# Patient Record
Sex: Female | Born: 1965
Health system: Southern US, Community
[De-identification: ages and names within clinical notes are randomized; demographics above are authoritative.]

## PROBLEM LIST (undated history)

## (undated) DIAGNOSIS — K219 Gastro-esophageal reflux disease without esophagitis: Secondary | ICD-10-CM

## (undated) DIAGNOSIS — Z716 Tobacco abuse counseling: Secondary | ICD-10-CM

## (undated) DIAGNOSIS — L600 Ingrowing nail: Secondary | ICD-10-CM

## (undated) DIAGNOSIS — K581 Irritable bowel syndrome with constipation: Secondary | ICD-10-CM

## (undated) DIAGNOSIS — R109 Unspecified abdominal pain: Secondary | ICD-10-CM

## (undated) DIAGNOSIS — E669 Obesity, unspecified: Secondary | ICD-10-CM

## (undated) DIAGNOSIS — E785 Hyperlipidemia, unspecified: Secondary | ICD-10-CM

## (undated) DIAGNOSIS — J40 Bronchitis, not specified as acute or chronic: Secondary | ICD-10-CM

## (undated) DIAGNOSIS — I10 Essential (primary) hypertension: Secondary | ICD-10-CM

## (undated) DIAGNOSIS — E119 Type 2 diabetes mellitus without complications: Secondary | ICD-10-CM

## (undated) DIAGNOSIS — I219 Acute myocardial infarction, unspecified: Secondary | ICD-10-CM

## (undated) DIAGNOSIS — R5383 Other fatigue: Secondary | ICD-10-CM

## (undated) DIAGNOSIS — M1711 Unilateral primary osteoarthritis, right knee: Secondary | ICD-10-CM

## (undated) DIAGNOSIS — K589 Irritable bowel syndrome without diarrhea: Secondary | ICD-10-CM

## (undated) DIAGNOSIS — E118 Type 2 diabetes mellitus with unspecified complications: Secondary | ICD-10-CM

## (undated) DIAGNOSIS — E559 Vitamin D deficiency, unspecified: Secondary | ICD-10-CM

## (undated) HISTORY — DX: Other fatigue: R53.83

## (undated) HISTORY — DX: Unilateral primary osteoarthritis, right knee: M17.11

## (undated) HISTORY — DX: Essential (primary) hypertension: I10

## (undated) HISTORY — DX: Irritable bowel syndrome with constipation: K58.1

## (undated) HISTORY — DX: Acute myocardial infarction, unspecified: I21.9

## (undated) HISTORY — DX: Unspecified abdominal pain: R10.9

## (undated) HISTORY — DX: Ingrowing nail: L60.0

## (undated) HISTORY — PX: ABDOMINAL HYSTERECTOMY: SHX81

## (undated) HISTORY — DX: Irritable bowel syndrome without diarrhea: K58.9

## (undated) HISTORY — DX: Obesity, unspecified: E66.9

## (undated) HISTORY — DX: Gastro-esophageal reflux disease without esophagitis: K21.9

## (undated) HISTORY — DX: Tobacco abuse counseling: Z71.6

## (undated) HISTORY — PX: JOINT REPLACEMENT: SHX530

## (undated) HISTORY — DX: Type 2 diabetes mellitus without complications: E11.9

## (undated) HISTORY — DX: Type 2 diabetes mellitus with unspecified complications: E11.8

## (undated) HISTORY — PX: CHOLECYSTECTOMY: SHX55

## (undated) HISTORY — PX: LUNG BIOPSY: SHX232

## (undated) HISTORY — DX: Vitamin D deficiency, unspecified: E55.9

## (undated) HISTORY — PX: APPENDECTOMY: SHX54

## (undated) HISTORY — DX: Hyperlipidemia, unspecified: E78.5

## (undated) HISTORY — DX: Bronchitis, not specified as acute or chronic: J40

---

## 2004-01-05 DIAGNOSIS — I219 Acute myocardial infarction, unspecified: Secondary | ICD-10-CM

## 2004-01-05 HISTORY — DX: Acute myocardial infarction, unspecified: I21.9

## 2017-02-18 ENCOUNTER — Encounter: Payer: Self-pay | Admitting: Family Medicine

## 2017-02-18 ENCOUNTER — Ambulatory Visit (INDEPENDENT_AMBULATORY_CARE_PROVIDER_SITE_OTHER): Payer: Medicare Other | Admitting: Family Medicine

## 2017-02-18 ENCOUNTER — Telehealth: Payer: Self-pay | Admitting: Family Medicine

## 2017-02-18 ENCOUNTER — Other Ambulatory Visit: Payer: Self-pay

## 2017-02-18 ENCOUNTER — Other Ambulatory Visit (HOSPITAL_COMMUNITY)
Admission: RE | Admit: 2017-02-18 | Discharge: 2017-02-18 | Disposition: A | Discharge status: Home / Self Care | Payer: Medicare Other | Source: Ambulatory Visit | Attending: Family Medicine | Admitting: Family Medicine

## 2017-02-18 VITALS — BP 126/72 | HR 88 | Temp 97.7°F | Resp 16 | Ht 66.0 in | Wt 186.5 lb

## 2017-02-18 DIAGNOSIS — Z1211 Encounter for screening for malignant neoplasm of colon: Secondary | ICD-10-CM | POA: Diagnosis not present

## 2017-02-18 DIAGNOSIS — E785 Hyperlipidemia, unspecified: Secondary | ICD-10-CM

## 2017-02-18 DIAGNOSIS — K581 Irritable bowel syndrome with constipation: Secondary | ICD-10-CM | POA: Diagnosis not present

## 2017-02-18 DIAGNOSIS — I10 Essential (primary) hypertension: Secondary | ICD-10-CM | POA: Diagnosis not present

## 2017-02-18 DIAGNOSIS — J40 Bronchitis, not specified as acute or chronic: Secondary | ICD-10-CM

## 2017-02-18 DIAGNOSIS — N898 Other specified noninflammatory disorders of vagina: Secondary | ICD-10-CM | POA: Insufficient documentation

## 2017-02-18 DIAGNOSIS — E1140 Type 2 diabetes mellitus with diabetic neuropathy, unspecified: Secondary | ICD-10-CM

## 2017-02-18 DIAGNOSIS — R5383 Other fatigue: Secondary | ICD-10-CM

## 2017-02-18 DIAGNOSIS — Z113 Encounter for screening for infections with a predominantly sexual mode of transmission: Secondary | ICD-10-CM | POA: Diagnosis present

## 2017-02-18 DIAGNOSIS — Z1239 Encounter for other screening for malignant neoplasm of breast: Secondary | ICD-10-CM

## 2017-02-18 DIAGNOSIS — Z794 Long term (current) use of insulin: Secondary | ICD-10-CM | POA: Diagnosis not present

## 2017-02-18 DIAGNOSIS — E559 Vitamin D deficiency, unspecified: Secondary | ICD-10-CM | POA: Diagnosis not present

## 2017-02-18 DIAGNOSIS — Z1231 Encounter for screening mammogram for malignant neoplasm of breast: Secondary | ICD-10-CM

## 2017-02-18 DIAGNOSIS — Z23 Encounter for immunization: Secondary | ICD-10-CM

## 2017-02-18 MED ORDER — AZITHROMYCIN 250 MG PO TABS: ORAL_TABLET | ORAL | 0 refills | Status: DC

## 2017-02-18 NOTE — Patient Instructions (Addendum)
Bring in BP cuff and readings Bring in all medications to visit. Follow up in 2-4 weeks.

## 2017-02-18 NOTE — Progress Notes (Signed)
Patient ID: Tasha Aguilar, female    DOB: [birth year], 52 y.o.   MRN: [id number]  Chief Complaint  Patient presents with  . Establish Care    Allergies Latex  Subjective:   Tasha Aguilar is a 52 y.o. female who presents to [facility] today.  HPI Here to establish care. Has not had a PCP in quite sometime.  Patient is fairly new to the area.  She [place] in [place].  She is originally from [place] and subsequently moved to [place], [place] then to [place], [place] and is now settled here.  She lives with her significant other whom she has been with for over 8 years.  She has 4 children who are now grow, from a previous marriage.  She does not work outside the home.  She reports that she has a history of diabetes, hypertension, and high cholesterol.  She does not remember what her last lab values were.  She reports that she has not been taking all of her medications as directed.  She reports that she does not always take the metformin or her insulin.  She reports that she believes that her sugars get low and that is why she decided not to take them.  However she does not check her blood sugars on a regular basis.  She does not have any documented hypoglycemic episodes.  She has never woken up in the middle of the night sweating or shaking.  She believes that she gets low sugars because she feels tired and weak.  She denies any lesions on her feet.  She does not know the last time that she had her eyes checked.  She reports she does take her blood pressure medicines daily.  She denies any side effects or problems with them.  However she does not like being on 3 medications to control her blood pressure.  She denies any chest pain, shortness of breath, or swelling in her extremities. She reports she is not always compliant with her cholesterol medications.  She is on a statin and TriCor.  She denies any myalgias.  She is not sure what her last cholesterol  values were or when they were checked. She reports she would like to be screened for any sexually transmitted infections today.  She reports that she has concerns that her partner could have cheated on her.  She denies any pelvic pain, abdominal pain, abnormal vaginal discharge, dysuria.  She denies any postcoital bleeding. She reports that she does have a history of bacterial vaginosis.  She reports that she takes tub baths every day.  She reports that since she quit douching and using feminine hygiene products that her bacterial vaginosis episodes have decreased.  She reports that she has had a cough for the past 2-1/2-3 weeks.  Reports her cough is productive of yellow-green sputum.  She does smoke approximately 1 pack of cigarettes per day for many years.  She is not ready to quit at this time.  She reports that she is cut down a bit since she has been sick.  She denies any chest tightness or shortness of breath.  She reports that the cough is junky and she coughs up stuff from her chest throughout the day.  Reports that at times she has some green drainage, out of her nose and not just her mouth.  She denies any fevers, vomiting, or chills.  She has not been treated or take any medication for this  infection.  She reports that nothing makes the cough better or worse.  She has never had a history of asthma as an adult.  She denies any symptoms of reflux.  She denies any swelling in her throat or airway.  She reports that she is always been followed by the gastroenterologist due to her irritable bowel syndrome.  Reports that she has been on prescription medication for this condition and would like to establish care with a gastroenterologist in this area.  She requests a referral.  She reports she is also due to have a colonoscopy performed but she would like to have the testing done that she is seen on the television.  She reports that she did have a colonoscopy greater than 10 years ago when she was  diagnosed with IBS.  She denies any family history of colon cancer.  She denies any personal history of polyps or inflammatory bowel disease.  She denies any melena, hematochezia, or bright red blood per rectum.  She reports that her energy can be low at times.  She reports that she has been taking vitamin D 50,000 units each week for over a year now.  She reports that she thought she was supposed to continue at this dose for an extended period of time.    Past Medical History:  Diagnosis Date  . AMI (acute myocardial infarction) (HCC)   . Diabetes mellitus without complication (HCC)   . Hyperlipidemia   . Hypertension     Past Surgical History:  Procedure Laterality Date  . ABDOMINAL HYSTERECTOMY     due to menorrhagia  . APPENDECTOMY    . CESAREAN SECTION    . CHOLECYSTECTOMY    . JOINT REPLACEMENT     total left knee  . LUNG BIOPSY      Family History  Problem Relation Age of Onset  . Diabetes Mother   . Glaucoma Mother   . Hypertension Mother   . Hypertension Father   . Diabetes Father   . Heart disease Father   . Glaucoma Father   . Throat cancer Father   . Kidney disease Brother   . Diabetes Brother   . Hypertension Brother   . Hypertension Brother   . Diabetes Brother      Social History   Socioeconomic History  . Marital status: Single    Spouse name: None  . Number of children: 4  . Years of education: 65  . Highest education level: 12th grade  Social Needs  . Financial resource strain: None  . Food insecurity - worry: None  . Food insecurity - inability: None  . Transportation needs - medical: None  . Transportation needs - non-medical: None  Occupational History  . None  Tobacco Use  . Smoking status: Current Every Day Smoker    Packs/day: 1.00    Years: 34.00    Pack years: 34.00  . Smokeless tobacco: Never Used  Substance and Sexual Activity  . Alcohol use: No    Frequency: Never  . Drug use: No  . Sexual activity: Yes    Birth  control/protection: Surgical  Other Topics Concern  . None  Social History Narrative   Lives in [place], single but lives with man. Has children, 4. Grew up in [place] and moved to [place] in 2017. Moved to [place] in 2018.    Enjoys movies, tv.    Eats all food groups.   Wear seat belt.   Drives.    Does not go  to church.    Current Outpatient Medications on File Prior to Visit  Medication Sig Dispense Refill  . albuterol (PROAIR HFA) 108 (90 Base) MCG/ACT inhaler Inhale into the lungs every 6 (six) hours as needed for wheezing or shortness of breath.    Marland Kitchen amLODipine (NORVASC) 10 MG tablet Take 10 mg by mouth daily.    Marland Kitchen atorvastatin (LIPITOR) 40 MG tablet Take 40 mg by mouth daily.    . diclofenac (VOLTAREN) 75 MG EC tablet Take 75 mg by mouth 2 (two) times daily.    . diclofenac sodium (VOLTAREN) 1 % GEL Apply topically 4 (four) times daily.    . fenofibrate (TRICOR) 145 MG tablet Take 145 mg by mouth daily.    Marland Kitchen gabapentin (NEURONTIN) 300 MG capsule Take 300 mg by mouth 3 (three) times daily.    Marland Kitchen glipiZIDE (GLUCOTROL) 10 MG tablet Take 10 mg by mouth daily before breakfast.    . Lancets 30G MISC by Does not apply route.    Marland Kitchen lisinopril-hydrochlorothiazide (PRINZIDE,ZESTORETIC) 20-12.5 MG tablet Take 1 tablet by mouth daily.    . metFORMIN (GLUCOPHAGE) 1000 MG tablet Take 1,000 mg by mouth daily.     . metoprolol succinate (TOPROL-XL) 100 MG 24 hr tablet Take 100 mg by mouth daily. Take with or immediately following a meal.    . montelukast (SINGULAIR) 10 MG tablet Take 10 mg by mouth at bedtime.    Marland Kitchen tiZANidine (ZANAFLEX) 4 MG capsule Take 4 mg by mouth 3 (three) times daily.    . Vitamin D, Ergocalciferol, (DRISDOL) 50000 units CAPS capsule Take 50,000 Units by mouth every 7 (seven) days.    . Eluxadoline (VIBERZI) 75 MG TABS Take by mouth.    . Insulin Detemir (LEVEMIR FLEXTOUCH) 100 UNIT/ML Pen Inject into the skin daily at 10 pm.     No current facility-administered medications on file  prior to visit.     Review of Systems  Constitutional: Negative for activity change, appetite change and fever.  HENT: Positive for congestion, postnasal drip, rhinorrhea and sinus pressure. Negative for dental problem, mouth sores, nosebleeds, sneezing, sore throat, tinnitus, trouble swallowing and voice change.   Eyes: Negative for visual disturbance.  Respiratory: Positive for cough. Negative for chest tightness and shortness of breath.   Cardiovascular: Negative for chest pain, palpitations and leg swelling.  Gastrointestinal: Positive for constipation. Negative for abdominal pain, nausea and vomiting.  Genitourinary: Negative for dysuria, frequency, urgency, vaginal discharge and vaginal pain.  Musculoskeletal: Negative for arthralgias, gait problem and neck pain.  Skin: Negative for rash.  Neurological: Negative for dizziness, tremors, syncope, weakness and light-headedness.  Hematological: Negative for adenopathy.  Psychiatric/Behavioral: Negative for behavioral problems, dysphoric mood and sleep disturbance. The patient is not nervous/anxious.      Objective:   BP 126/72 (BP Location: Left Arm, Patient Position: Sitting, Cuff Size: Normal)   Pulse 88   Temp 97.7 F (36.5 C) (Temporal)   Resp 16   Ht 5\' 6"  (1.676 m)   Wt 186 lb 8 oz (84.6 kg)   SpO2 98%   BMI 30.10 kg/m   Physical Exam  Constitutional: She is oriented to person, place, and time. She appears well-developed and well-nourished. No distress.  HENT:  Head: Normocephalic and atraumatic.  Right Ear: External ear normal.  Left Ear: External ear normal.  Nose: Nose normal.  Mouth/Throat: Oropharynx is clear and moist. No oropharyngeal exudate.  Eyes: Conjunctivae and EOM are normal. Pupils are equal, round, and reactive  to light. No scleral icterus.  Neck: Normal range of motion. Neck supple. No JVD present. No tracheal deviation present. No thyromegaly present.  Cardiovascular: Normal rate, regular rhythm and  normal heart sounds.  Pulmonary/Chest: Effort normal and breath sounds normal. No respiratory distress. She has no wheezes. She exhibits no tenderness.  Abdominal: Soft. Bowel sounds are normal. She exhibits no distension. There is no tenderness.  Musculoskeletal: Normal range of motion.  Lymphadenopathy:    She has no cervical adenopathy.  Neurological: She is alert and oriented to person, place, and time. No cranial nerve deficit or sensory deficit. Coordination normal.  Skin: Skin is warm and dry.  Psychiatric: She has a normal mood and affect. Her behavior is normal. Judgment and thought content normal.  Nursing note and vitals reviewed.    Assessment and Plan  1. Essential hypertension Discussed with patient today that her blood pressure is under good control.  We discussed her goal blood pressure is less than 130/80.  She will continue all of her medications at this time.  She is not happy about being on 3 medications for blood pressure, however her blood pressure  is at goal.  I did ask her to please bring in all her medication bottles to her next visit.  I told her there is a possibility of stopping one medication and increasing the lisinopril/HCTZ.  Lifestyle modifications discussed with patient including a diet emphasizing vegetables, fruits, and whole grains. Limiting intake of sodium to less than 2,400 mg per day.  Recommendations discussed include consuming low-fat dairy products, poultry, fish, legumes, non-tropical vegetable oils, and nuts; and limiting intake of sweets, sugar-sweetened beverages, and red meat. Discussed following a plan such as the Dietary Approaches to Stop Hypertension (DASH) diet. Patient to read up on this diet.    2. Hyperlipidemia LDL goal <70 Patient is currently on atorvastatin and TriCor.  We discussed her LDL goal.  Will check FLP today and determine if she is at goal.  She would like to stop 1 of these medications.  I am in agreement as long as her  triglycerides are not vastly elevated.  She will continue both medications at this time and we will discuss at her follow-up.  Hyperlipidemia and the associated risk of ASCVD were discussed today. Primary vs. Secondary prevention of ASCVD were discussed and how it relates to patient morbidity, mortality, and quality of life. Shared decision making with patient including the risks of statins vs.benefits of ASCVD risk reduction discussed.  Risks of stains discussed including myopathy, rhabdomyoloysis, liver problems, increased risk of diabetes discussed. We discussed heart healthy diet, lifestyle modifications, risk factor modifications, and adherence to the recommended treatment plan. We discussed the need to periodically monitor lipid panel and liver function tests while on statin therapy.   - Lipid panel  3. Type 2 diabetes mellitus with diabetic neuropathy, with long-term current use of insulin (HCC) Patient has no idea where her current A1c is.  She is currently on metformin, Glucotrol, and reports that she rarely uses her basal insulin.  We will check hemoglobin A1c and adjust as needed. - Hemoglobin A1c - COMPLETE METABOLIC PANEL WITH GFR - Microalbumin / creatinine urine ratio  4. Bronchitis Patient counseled in detail regarding the risks of medication. Told to call or return to clinic if develop any worrisome signs or symptoms. Patient voiced understanding.   for bronchitis at this time.rhmed - azithromycin (ZITHROMAX) 250 MG tablet; Take 2 pills for the first day and one pill  each day for the next four days.  Dispense: 6 tablet; Refill: 0  5. Screen for STD (sexually transmitted disease) Patient request screening today. - HIV antibody - RPR - Hepatitis panel, acute - Urine cytology ancillary only  6. Screening for breast cancer Mammogram referral placed.  We will plan to do clinical breast exam at subsequent visit. - MM Digital Screening; Future  7. Immunization due Vaccinations  performed today.  Patient defers influenza vaccine.  She was administered Tdap and pneumonia shot today. Will request immunization record today from previous PCP.  - Tdap vaccine greater than or equal to 7yo IM - Flu Vaccine QUAD 6+ mos PF IM (Fluarix Quad PF) - Pneumococcal conjugate vaccine 13-valent  8. Screen for colon cancer Referral placed today.  - Cologuard  9. Vitamin D deficiency Has been on long-term 50,000 units each week. Check labs today.  - VITAMIN D 25 Hydroxy (Vit-D Deficiency, Fractures)  10. Fatigue, unspecified type Check labs today.  - TSH - CBC with Differential 11.  IBS Referred to gastroenterology per patient request. Return in about 4 weeks (around 03/18/2017). Aliene Beams, MD 02/18/2017

## 2017-02-18 NOTE — Telephone Encounter (Signed)
Patient had asked at the end of her appointment if Dr. Tracie HarrierHagler would write a prescription for an antibiotic prior to a dental appointment on Wednesday due to her knee replacement. Per Dr. Tracie HarrierHagler, it is no longer recommended to prescribe antibiotics prior to dental work like that. She states if the dentist wants her to have one, he will need to prescribe. Called patient to inform, phone just rings with no voicemail.

## 2017-02-19 ENCOUNTER — Encounter: Payer: Self-pay | Admitting: Family Medicine

## 2017-02-19 DIAGNOSIS — R5383 Other fatigue: Secondary | ICD-10-CM | POA: Insufficient documentation

## 2017-02-19 DIAGNOSIS — E559 Vitamin D deficiency, unspecified: Secondary | ICD-10-CM

## 2017-02-19 DIAGNOSIS — I10 Essential (primary) hypertension: Secondary | ICD-10-CM | POA: Insufficient documentation

## 2017-02-19 DIAGNOSIS — J40 Bronchitis, not specified as acute or chronic: Secondary | ICD-10-CM | POA: Insufficient documentation

## 2017-02-19 DIAGNOSIS — E785 Hyperlipidemia, unspecified: Secondary | ICD-10-CM | POA: Insufficient documentation

## 2017-02-19 DIAGNOSIS — Z23 Encounter for immunization: Secondary | ICD-10-CM | POA: Insufficient documentation

## 2017-02-19 DIAGNOSIS — E118 Type 2 diabetes mellitus with unspecified complications: Secondary | ICD-10-CM

## 2017-02-19 DIAGNOSIS — K581 Irritable bowel syndrome with constipation: Secondary | ICD-10-CM | POA: Insufficient documentation

## 2017-02-19 DIAGNOSIS — Z1211 Encounter for screening for malignant neoplasm of colon: Secondary | ICD-10-CM | POA: Insufficient documentation

## 2017-02-19 DIAGNOSIS — Z1239 Encounter for other screening for malignant neoplasm of breast: Secondary | ICD-10-CM | POA: Insufficient documentation

## 2017-02-19 HISTORY — DX: Irritable bowel syndrome with constipation: K58.1

## 2017-02-19 HISTORY — DX: Essential (primary) hypertension: I10

## 2017-02-19 HISTORY — DX: Vitamin D deficiency, unspecified: E55.9

## 2017-02-19 HISTORY — DX: Bronchitis, not specified as acute or chronic: J40

## 2017-02-19 HISTORY — DX: Hyperlipidemia, unspecified: E78.5

## 2017-02-19 HISTORY — DX: Type 2 diabetes mellitus with unspecified complications: E11.8

## 2017-02-19 HISTORY — DX: Other fatigue: R53.83

## 2017-02-21 ENCOUNTER — Telehealth: Payer: Self-pay | Admitting: Family Medicine

## 2017-02-21 LAB — CBC WITH DIFFERENTIAL/PLATELET
Basophils Absolute: 11 cells/uL (ref 0–200)
Basophils Relative: 0.2 %
Eosinophils Absolute: 59 cells/uL (ref 15–500)
Eosinophils Relative: 1.1 %
HCT: 39.1 % (ref 35.0–45.0)
Hemoglobin: 12.9 g/dL (ref 11.7–15.5)
Lymphs Abs: 2252 cells/uL (ref 850–3900)
MCH: 26.2 pg — ABNORMAL LOW (ref 27.0–33.0)
MCHC: 33 g/dL (ref 32.0–36.0)
MCV: 79.5 fL — ABNORMAL LOW (ref 80.0–100.0)
MPV: 10.4 fL (ref 7.5–12.5)
Monocytes Relative: 6.5 %
Neutro Abs: 2727 cells/uL (ref 1500–7800)
Neutrophils Relative %: 50.5 %
Platelets: 340 10*3/uL (ref 140–400)
RBC: 4.92 10*6/uL (ref 3.80–5.10)
RDW: 13.4 % (ref 11.0–15.0)
Total Lymphocyte: 41.7 %
WBC mixed population: 351 cells/uL (ref 200–950)
WBC: 5.4 10*3/uL (ref 3.8–10.8)

## 2017-02-21 LAB — RPR: RPR Ser Ql: NONREACTIVE

## 2017-02-21 LAB — URINE CYTOLOGY ANCILLARY ONLY
Chlamydia: NEGATIVE
Neisseria Gonorrhea: NEGATIVE
Trichomonas: NEGATIVE

## 2017-02-21 LAB — COMPLETE METABOLIC PANEL WITH GFR
AG Ratio: 1.5 (calc) (ref 1.0–2.5)
ALT: 17 U/L (ref 6–29)
AST: 18 U/L (ref 10–35)
Albumin: 4.6 g/dL (ref 3.6–5.1)
Alkaline phosphatase (APISO): 109 U/L (ref 33–130)
BUN/Creatinine Ratio: 14 (calc) (ref 6–22)
BUN: 15 mg/dL (ref 7–25)
CO2: 27 mmol/L (ref 20–32)
Calcium: 10.7 mg/dL — ABNORMAL HIGH (ref 8.6–10.4)
Chloride: 109 mmol/L (ref 98–110)
Creat: 1.08 mg/dL — ABNORMAL HIGH (ref 0.50–1.05)
GFR, Est African American: 68 mL/min/{1.73_m2} (ref >=60)
GFR, Est Non African American: 59 mL/min/{1.73_m2} — ABNORMAL LOW (ref >=60)
Globulin: 3.1 g/dL (calc) (ref 1.9–3.7)
Glucose, Bld: 66 mg/dL (ref 65–99)
Potassium: 4.3 mmol/L (ref 3.5–5.3)
Sodium: 143 mmol/L (ref 135–146)
Total Bilirubin: 0.4 mg/dL (ref 0.2–1.2)
Total Protein: 7.7 g/dL (ref 6.1–8.1)

## 2017-02-21 LAB — MICROALBUMIN / CREATININE URINE RATIO
Creatinine, Urine: 145 mg/dL (ref 20–275)
Microalb Creat Ratio: 19 mcg/mg creat (ref <30)
Microalb, Ur: 2.8 mg/dL

## 2017-02-21 LAB — HEPATITIS PANEL, ACUTE
Hep A IgM: NONREACTIVE
Hep B C IgM: NONREACTIVE
Hepatitis B Surface Ag: NONREACTIVE
Hepatitis C Ab: NONREACTIVE
SIGNAL TO CUT-OFF: 0.01 (ref <1.00)

## 2017-02-21 LAB — LIPID PANEL
Cholesterol: 191 mg/dL (ref <200)
HDL: 38 mg/dL — ABNORMAL LOW (ref >50)
LDL Cholesterol (Calc): 127 mg/dL (calc) — ABNORMAL HIGH
Non-HDL Cholesterol (Calc): 153 mg/dL (calc) — ABNORMAL HIGH (ref <130)
Total CHOL/HDL Ratio: 5 (calc) — ABNORMAL HIGH (ref <5.0)
Triglycerides: 147 mg/dL (ref <150)

## 2017-02-21 LAB — VITAMIN D 25 HYDROXY (VIT D DEFICIENCY, FRACTURES): Vit D, 25-Hydroxy: 39 ng/mL (ref 30–100)

## 2017-02-21 LAB — HEMOGLOBIN A1C
Hgb A1c MFr Bld: 10 % of total Hgb — ABNORMAL HIGH (ref <5.7)
Mean Plasma Glucose: 240 (calc)
eAG (mmol/L): 13.3 (calc)

## 2017-02-21 LAB — TSH: TSH: 1.26 mIU/L

## 2017-02-21 LAB — HIV ANTIBODY (ROUTINE TESTING W REFLEX): HIV 1&2 Ab, 4th Generation: NONREACTIVE

## 2017-02-21 NOTE — Telephone Encounter (Signed)
I have tried to call patient back about this before. Per Dr. Tracie HarrierHagler, this is no longer recommended practice. If her dentist wants her to have the antibiotic, he should prescribed. Tried to return call, no answer.

## 2017-02-21 NOTE — Telephone Encounter (Signed)
Patient left a voicemail requesting information about a previous request for antibiotics for a dental appt, due to a total knee replacement she had..  [phone]

## 2017-02-22 ENCOUNTER — Encounter: Payer: Self-pay | Admitting: Internal Medicine

## 2017-02-22 ENCOUNTER — Other Ambulatory Visit: Payer: Self-pay | Admitting: Family Medicine

## 2017-02-23 ENCOUNTER — Telehealth: Payer: Self-pay | Admitting: Family Medicine

## 2017-02-23 LAB — URINE CYTOLOGY ANCILLARY ONLY: Candida vaginitis: NEGATIVE

## 2017-02-23 MED ORDER — METRONIDAZOLE 500 MG PO TABS: 500.0000 mg | ORAL_TABLET | Freq: Two times a day (BID) | ORAL | 0 refills | Status: DC

## 2017-02-23 NOTE — Telephone Encounter (Signed)
Please call and advised that her HIV, syphilis, and hepatitis testing are negative.  Advised her that her labs do show that she has bacterial vaginosis infection and we should treat with antibiotics.  She should start Flagyl 500 mg 1 pill twice a day for 7 days.  She cannot drink alcohol while taking this medicine for 3 days after stopping this medication.  If she does it can make her very sick.  Please advise her that her hemoglobin A1c is elevated at 10%.  Has she been taking the metformin twice a day, the Glucotrol 10 mg a day, and using the Levemir at night.  It is very important for me to know what medicine she has actually been using consistently over the past 3 months.  It is important for me to know because it affects what medicines I put her on now and what dose of insulin I would put her on to help get her sugars under control.

## 2017-02-24 NOTE — Telephone Encounter (Signed)
Called patient regarding message below. No answer, unable to leave message.  

## 2017-02-25 ENCOUNTER — Other Ambulatory Visit: Payer: Self-pay | Admitting: Family Medicine

## 2017-02-25 NOTE — Telephone Encounter (Signed)
Called patient regarding message below. No answer, unable to leave message.  

## 2017-03-02 NOTE — Telephone Encounter (Signed)
I have been unable to reach this patient by phone.  A letter is being sent.

## 2017-03-10 DIAGNOSIS — H35032 Hypertensive retinopathy, left eye: Secondary | ICD-10-CM | POA: Diagnosis not present

## 2017-03-10 DIAGNOSIS — H35031 Hypertensive retinopathy, right eye: Secondary | ICD-10-CM | POA: Diagnosis not present

## 2017-03-10 LAB — HM DIABETES EYE EXAM

## 2017-03-14 DIAGNOSIS — Z1212 Encounter for screening for malignant neoplasm of rectum: Secondary | ICD-10-CM | POA: Diagnosis not present

## 2017-03-14 DIAGNOSIS — Z1211 Encounter for screening for malignant neoplasm of colon: Secondary | ICD-10-CM | POA: Diagnosis not present

## 2017-03-14 LAB — COLOGUARD: Cologuard: NEGATIVE

## 2017-03-23 ENCOUNTER — Encounter: Payer: Self-pay | Admitting: Family Medicine

## 2017-03-23 ENCOUNTER — Other Ambulatory Visit: Payer: Self-pay

## 2017-03-23 ENCOUNTER — Other Ambulatory Visit: Payer: Self-pay | Admitting: Family Medicine

## 2017-03-23 ENCOUNTER — Ambulatory Visit (INDEPENDENT_AMBULATORY_CARE_PROVIDER_SITE_OTHER): Payer: Medicare Other | Admitting: Family Medicine

## 2017-03-23 VITALS — BP 162/98 | HR 68 | Temp 98.0°F | Resp 16 | Ht 66.0 in | Wt 185.8 lb

## 2017-03-23 DIAGNOSIS — E119 Type 2 diabetes mellitus without complications: Secondary | ICD-10-CM | POA: Diagnosis not present

## 2017-03-23 DIAGNOSIS — I10 Essential (primary) hypertension: Secondary | ICD-10-CM

## 2017-03-23 DIAGNOSIS — E559 Vitamin D deficiency, unspecified: Secondary | ICD-10-CM | POA: Diagnosis not present

## 2017-03-23 DIAGNOSIS — E785 Hyperlipidemia, unspecified: Secondary | ICD-10-CM | POA: Diagnosis not present

## 2017-03-23 DIAGNOSIS — Z1231 Encounter for screening mammogram for malignant neoplasm of breast: Secondary | ICD-10-CM

## 2017-03-23 DIAGNOSIS — E1169 Type 2 diabetes mellitus with other specified complication: Secondary | ICD-10-CM | POA: Diagnosis not present

## 2017-03-23 DIAGNOSIS — Z720 Tobacco use: Secondary | ICD-10-CM | POA: Diagnosis not present

## 2017-03-23 MED ORDER — HYDROCHLOROTHIAZIDE 25 MG PO TABS: 25.0000 mg | ORAL_TABLET | Freq: Every day | ORAL | 3 refills | Status: DC

## 2017-03-23 MED ORDER — LISINOPRIL 40 MG PO TABS: 40.0000 mg | ORAL_TABLET | Freq: Every day | ORAL | 3 refills | Status: AC

## 2017-03-23 MED ORDER — AMLODIPINE BESYLATE 10 MG PO TABS: 10.0000 mg | ORAL_TABLET | Freq: Every day | ORAL | 1 refills | Status: DC

## 2017-03-23 MED ORDER — VITAMIN D (ERGOCALCIFEROL) 1.25 MG (50000 UNIT) PO CAPS: 50000.0000 [IU] | ORAL_CAPSULE | ORAL | 1 refills | Status: DC

## 2017-03-23 MED ORDER — VARENICLINE TARTRATE 0.5 MG X 11 & 1 MG X 42 PO MISC: ORAL | 0 refills | Status: DC

## 2017-03-23 MED ORDER — ATORVASTATIN CALCIUM 40 MG PO TABS: 40.0000 mg | ORAL_TABLET | Freq: Every day | ORAL | 1 refills | Status: DC

## 2017-03-23 MED ORDER — FENOFIBRATE 145 MG PO TABS: 145.0000 mg | ORAL_TABLET | Freq: Every day | ORAL | 0 refills | Status: DC

## 2017-03-23 MED ORDER — GLIPIZIDE ER 5 MG PO TB24: 5.0000 mg | ORAL_TABLET | Freq: Every day | ORAL | 1 refills | Status: DC

## 2017-03-23 NOTE — Progress Notes (Signed)
Patient ID: Tasha Aguilar, female    DOB: 07/27/1965, 52 y.o.   MRN: [id number]  Chief Complaint  Patient presents with  . Follow-up    Allergies Latex  Subjective:   Tasha Aguilar is a 52 y.o. female who presents to [facility] today.  HPI [name] presents today for follow-up visit.  She reports that she has been out of her blood pressure medication and has not taken them in several weeks.  She denies any headaches, chest pain, shortness of breath, or swelling in her extremities.  She has previously been on lisinopril/HCTZ 20/12.5 mg a day, amlodipine 10 mg a day, and Toprol-XL 100 mg a day.   Has been taking the lipitor and tricor each day.   She reports that for her blood sugar she has been doing the metformin 1000 mg twice a day since she was last seen here.  She has not been taking the glipizide at all.  She has not been using the insulin.  She reports that if she takes the glipizide 10 mg that she feels like her sugars drop and she gets a hypoglycemic episode.  She would like to review her labs today.  She did not have her cholesterol done at the last visit.  In addition, she had a slightly elevated calcium on her blood work.  She is taking vitamin D 50,000 units a week.  Her vitamin D levels were within the normal range but not exactly at target.  She denies any chest pain, shortness of breath, or swelling in her extremities.  She reports her mood is good.  Energy level has been better.  Denies any side effects with her current medications.  She does bring in reading of her blood pressure which is been in the 140s-180s over 90s-100s.  Most of her readings have been in the 160s-170 systolic.  She denies any headache, nausea, vomiting, vision changes.  Denies any numbness or tingling in her extremities.  She is not currently on an aspirin.  She brings in readings of her blood sugars which have improved over the past couple weeks and her morning sugars have been in  the 120s-140s.  Denies any hypoglycemic episodes.  She reports that she would like to quit smoking.  She used Chantix in the past and it really helped her.  She does report she quit smoking but then restarted again.  She has smoked for over 30 years, 1 pack/day.  She has her first cigarette in the morning.  She has had over 4 quit attempts.  Her brother recently passed away of cancer and she reports she is motivated to quit smoking at this time.   Past Medical History:  Diagnosis Date  . AMI (acute myocardial infarction) (HCC)   . Diabetes mellitus without complication (HCC)   . Hyperlipidemia   . Hypertension     Past Surgical History:  Procedure Laterality Date  . ABDOMINAL HYSTERECTOMY     due to menorrhagia  . APPENDECTOMY    . CESAREAN SECTION    . CHOLECYSTECTOMY    . JOINT REPLACEMENT     total left knee  . LUNG BIOPSY      Family History  Problem Relation Age of Onset  . Diabetes Mother   . Glaucoma Mother   . Hypertension Mother   . Hypertension Father   . Diabetes Father   . Heart disease Father   . Glaucoma Father   . Throat cancer Father   .  Kidney disease Brother   . Diabetes Brother   . Hypertension Brother   . Hypertension Brother   . Diabetes Brother      Social History   Socioeconomic History  . Marital status: Single    Spouse name: Not on file  . Number of children: 4  . Years of education: 41  . Highest education level: 12th grade  Occupational History  . Not on file  Social Needs  . Financial resource strain: Not on file  . Food insecurity:    Worry: Not on file    Inability: Not on file  . Transportation needs:    Medical: Not on file    Non-medical: Not on file  Tobacco Use  . Smoking status: Current Every Day Smoker    Packs/day: 1.00    Years: 34.00    Pack years: 34.00  . Smokeless tobacco: Never Used  Substance and Sexual Activity  . Alcohol use: No    Frequency: Never  . Drug use: No  . Sexual activity: Yes    Birth  control/protection: Surgical  Lifestyle  . Physical activity:    Days per week: Not on file    Minutes per session: Not on file  . Stress: Not on file  Relationships  . Social connections:    Talks on phone: Not on file    Gets together: Not on file    Attends religious service: Not on file    Active member of club or organization: Not on file    Attends meetings of clubs or organizations: Not on file    Relationship status: Not on file  Other Topics Concern  . Not on file  Social History Narrative   Lives in [place], single but lives with man. Has children, 4. Grew up in [place] and moved to [place] in 2017. Moved to [place] in 2018.    Enjoys movies, tv.    Eats all food groups.   Wear seat belt.   Drives.    Does not go to church.    Current Outpatient Medications on File Prior to Visit  Medication Sig Dispense Refill  . albuterol (PROAIR HFA) 108 (90 Base) MCG/ACT inhaler Inhale into the lungs every 6 (six) hours as needed for wheezing or shortness of breath.    . diclofenac sodium (VOLTAREN) 1 % GEL Apply topically 4 (four) times daily.    Marland Kitchen gabapentin (NEURONTIN) 300 MG capsule Take 300 mg by mouth 3 (three) times daily.    . metFORMIN (GLUCOPHAGE) 1000 MG tablet Take 1,000 mg by mouth daily.     . montelukast (SINGULAIR) 10 MG tablet Take 10 mg by mouth at bedtime.    Marland Kitchen tiZANidine (ZANAFLEX) 4 MG capsule Take 4 mg by mouth 3 (three) times daily.     No current facility-administered medications on file prior to visit.     Review of Systems  Constitutional: Negative for activity change, appetite change and fever.  Eyes: Negative for visual disturbance.  Respiratory: Negative for cough, chest tightness and shortness of breath.   Cardiovascular: Negative for chest pain, palpitations and leg swelling.  Gastrointestinal: Negative for abdominal pain, nausea and vomiting.  Genitourinary: Negative for dysuria, frequency and urgency.  Neurological: Negative for dizziness, syncope and  light-headedness.  Hematological: Negative for adenopathy.     Objective:   BP (!) 162/98 (BP Location: Left Arm, Patient Position: Sitting, Cuff Size: Normal)   Pulse 68   Temp 98 F (36.7 C) (Temporal)   Resp  16   Ht 5\' 6"  (1.676 m)   Wt 185 lb 12 oz (84.3 kg)   SpO2 99%   BMI 29.98 kg/m   Physical Exam  Constitutional: She is oriented to person, place, and time. She appears well-developed and well-nourished. No distress.  HENT:  Head: Normocephalic and atraumatic.  Eyes: Pupils are equal, round, and reactive to light.  Neck: Normal range of motion. Neck supple. No thyromegaly present.  Cardiovascular: Normal rate, regular rhythm and normal heart sounds.  Pulmonary/Chest: Effort normal and breath sounds normal. No respiratory distress.  Neurological: She is alert and oriented to person, place, and time. No cranial nerve deficit.  Skin: Skin is warm and dry.  Nursing note and vitals reviewed.    Assessment and Plan  1. Vitamin D deficiency Vitamin D levels are improving.  Continue for target goal of vitamin D levels of 50.  Continue medications for the next 3 months and then repeat. - Vitamin D, Ergocalciferol, (DRISDOL) 50000 units CAPS capsule; Take 1 capsule (50,000 Units total) by mouth every 7 (seven) days.  Dispense: 12 capsule; Refill: 1  2. Serum calcium elevated Serum calcium possibly elevated secondary to hyperglycemia.  Check PTH and calcium today. - PTH, Intact and Calcium - Calcium, ionized  3. Hyperlipidemia associated with type 2 diabetes mellitus (HCC) Check FLP.  Recommend continue atorvastatin and TriCor at this time due to her reported history of triglycerides in the 600s. - Lipid Profile - atorvastatin (LIPITOR) 40 MG tablet; Take 1 tablet (40 mg total) by mouth daily.  Dispense: 90 tablet; Refill: 1 - fenofibrate (TRICOR) 145 MG tablet; Take 1 tablet (145 mg total) by mouth daily.  Dispense: 90 tablet; Refill: 0  4. HTN, goal below 130/80 She is  currently on no blood pressure medication for the past several weeks.  Will restart Norvasc.  We will discontinue the Toprol-XL.  Will increase the dose of lisinopril to 40 mg a day.  And will continue HCTZ but at 25 mg a day. - amLODipine (NORVASC) 10 MG tablet; Take 1 tablet (10 mg total) by mouth daily.  Dispense: 90 tablet; Refill: 1 - lisinopril (PRINIVIL,ZESTRIL) 40 MG tablet; Take 1 tablet (40 mg total) by mouth daily.  Dispense: 90 tablet; Refill: 3 - hydrochlorothiazide (HYDRODIURIL) 25 MG tablet; Take 1 tablet (25 mg total) by mouth daily.  Dispense: 90 tablet; Refill: 3  5. Type 2 diabetes mellitus without complication, without long-term current use of insulin (HCC) She is not willing to be on any insulin at this time.  Her blood sugar readings do look improved.  We will add Glucotrol XL 5 mg 1 p.o. daily.  Patient was counseled regarding signs and symptoms of hypoglycemia and course of action to take if occurs.  She voiced understanding.  Plan to recheck A1c in 3 months.  Referral for diabetic nutrition is placed. - glipiZIDE (GLUCOTROL XL) 5 MG 24 hr tablet; Take 1 tablet (5 mg total) by mouth daily with breakfast.  Dispense: 90 tablet; Refill: 1 - Referral to Nutrition and Diabetes Services - Microalbumin / creatinine urine ratio  6. Tobacco abuse The 5 A's Model for treating Tobacco Use and Dependence was used today. I have identified and documented tobacco use status for this patient. I have urged the patient to quit tobacco use. At this time, the patient is willing and ready to attempt to quit. We have discussed medication options to aid in smoking cessation including nicotine replacement therapy (NRT), zyban, and chantix. We have  also discussed behavioral modifications, lifestyle changes, and patient support options to aid in tobacco cessation success. Patient was congratulated on their desire to make this positive change for their health. Patient instructed to keep their follow up to  ensure success.  We discussed chantix for smoking cessation. Discussed that serious neuropsychiatric adverse events have been reported in patients treated with this medication, including changes in mood, psychosis, hallucinations, paranoia, delusion, homicidal ideation, aggression, hostility, agitation, anxiety, suicide ideation, suicide attempt, and completed suicide. Advised patient that if taking this medication and any symptoms occur that the patient should STOP taking chantix and contact a healthcare provider via the ED or our office. Discussed that other common adverse reactions can occur with this medication such as nausea, abnormal dreams, constipation, gas, and vomiting. Patient told that upon starting medication that they should use caution when driving or operating machinery or engaging in hazardous activity until they know how this medication will affect them. Patient understands the risk of this medication and voiced understanding. Agrees to keep follow up appointments and follow the treatment plan.   - varenicline (CHANTIX STARTING MONTH PAK) 0.5 MG X 11 & 1 MG X 42 tablet; Take one 0.5 mg tablet by mouth once daily for 3 days, then increase to one 0.5 mg tablet twice daily for 4 days, then increase to one 1 mg tablet twice daily.  Dispense: 53 tablet; Refill: 0  Return in about 1 month (around 04/20/2017) for BP check. Aliene Beams, MD 03/24/2017

## 2017-03-23 NOTE — Patient Instructions (Signed)
Schedule Mammogram at check out.  Follow up in 1 month for BP

## 2017-03-24 LAB — LIPID PANEL
Cholesterol: 173 mg/dL (ref <200)
HDL: 35 mg/dL — ABNORMAL LOW (ref >50)
LDL Cholesterol (Calc): 116 mg/dL (calc) — ABNORMAL HIGH
Non-HDL Cholesterol (Calc): 138 mg/dL (calc) — ABNORMAL HIGH (ref <130)
Total CHOL/HDL Ratio: 4.9 (calc) (ref <5.0)
Triglycerides: 110 mg/dL (ref <150)

## 2017-03-24 LAB — CALCIUM, IONIZED: Calcium, Ion: 5.4 mg/dL (ref 4.8–5.6)

## 2017-03-24 LAB — MICROALBUMIN / CREATININE URINE RATIO
Creatinine, Urine: 135 mg/dL (ref 20–275)
Microalb Creat Ratio: 36 mcg/mg creat — ABNORMAL HIGH (ref <30)
Microalb, Ur: 4.8 mg/dL

## 2017-03-24 LAB — PTH, INTACT AND CALCIUM
Calcium: 9.7 mg/dL (ref 8.6–10.4)
PTH: 40 pg/mL (ref 14–64)

## 2017-03-25 ENCOUNTER — Encounter: Payer: Self-pay | Admitting: Family Medicine

## 2017-03-25 DIAGNOSIS — R0789 Other chest pain: Secondary | ICD-10-CM | POA: Diagnosis not present

## 2017-03-25 DIAGNOSIS — I10 Essential (primary) hypertension: Secondary | ICD-10-CM | POA: Diagnosis not present

## 2017-03-25 DIAGNOSIS — R079 Chest pain, unspecified: Secondary | ICD-10-CM | POA: Diagnosis not present

## 2017-03-25 DIAGNOSIS — E119 Type 2 diabetes mellitus without complications: Secondary | ICD-10-CM | POA: Diagnosis not present

## 2017-03-25 DIAGNOSIS — Z7984 Long term (current) use of oral hypoglycemic drugs: Secondary | ICD-10-CM | POA: Diagnosis not present

## 2017-03-25 DIAGNOSIS — Z79899 Other long term (current) drug therapy: Secondary | ICD-10-CM | POA: Diagnosis not present

## 2017-03-31 ENCOUNTER — Ambulatory Visit (HOSPITAL_COMMUNITY): Payer: Medicare Other

## 2017-04-13 ENCOUNTER — Ambulatory Visit (HOSPITAL_COMMUNITY)
Admission: RE | Admit: 2017-04-13 | Discharge: 2017-04-13 | Disposition: A | Discharge status: Home / Self Care | Payer: Medicare Other | Source: Ambulatory Visit | Attending: Family Medicine | Admitting: Family Medicine

## 2017-04-13 DIAGNOSIS — Z1231 Encounter for screening mammogram for malignant neoplasm of breast: Secondary | ICD-10-CM | POA: Insufficient documentation

## 2017-04-27 ENCOUNTER — Encounter: Payer: Self-pay | Admitting: Family Medicine

## 2017-04-27 ENCOUNTER — Other Ambulatory Visit: Payer: Self-pay

## 2017-04-27 ENCOUNTER — Ambulatory Visit (INDEPENDENT_AMBULATORY_CARE_PROVIDER_SITE_OTHER): Payer: Medicare Other | Admitting: Family Medicine

## 2017-04-27 VITALS — BP 150/82 | HR 82 | Temp 98.1°F | Resp 16 | Ht 66.0 in | Wt 188.2 lb

## 2017-04-27 DIAGNOSIS — L600 Ingrowing nail: Secondary | ICD-10-CM | POA: Insufficient documentation

## 2017-04-27 DIAGNOSIS — M1711 Unilateral primary osteoarthritis, right knee: Secondary | ICD-10-CM | POA: Diagnosis not present

## 2017-04-27 DIAGNOSIS — E785 Hyperlipidemia, unspecified: Secondary | ICD-10-CM

## 2017-04-27 DIAGNOSIS — I10 Essential (primary) hypertension: Secondary | ICD-10-CM | POA: Diagnosis not present

## 2017-04-27 DIAGNOSIS — Z716 Tobacco abuse counseling: Secondary | ICD-10-CM

## 2017-04-27 DIAGNOSIS — E1140 Type 2 diabetes mellitus with diabetic neuropathy, unspecified: Secondary | ICD-10-CM | POA: Diagnosis not present

## 2017-04-27 DIAGNOSIS — E669 Obesity, unspecified: Secondary | ICD-10-CM | POA: Diagnosis not present

## 2017-04-27 DIAGNOSIS — Z794 Long term (current) use of insulin: Secondary | ICD-10-CM

## 2017-04-27 DIAGNOSIS — E66811 Obesity, class 1: Secondary | ICD-10-CM

## 2017-04-27 HISTORY — DX: Obesity, unspecified: E66.9

## 2017-04-27 HISTORY — DX: Tobacco abuse counseling: Z71.6

## 2017-04-27 HISTORY — DX: Obesity, class 1: E66.811

## 2017-04-27 HISTORY — DX: Ingrowing nail: L60.0

## 2017-04-27 HISTORY — DX: Unilateral primary osteoarthritis, right knee: M17.11

## 2017-04-27 MED ORDER — GLIPIZIDE ER 10 MG PO TB24: 10.0000 mg | ORAL_TABLET | Freq: Every day | ORAL | 1 refills | Status: DC

## 2017-04-27 MED ORDER — METOPROLOL SUCCINATE ER 50 MG PO TB24: 50.0000 mg | ORAL_TABLET | Freq: Every day | ORAL | 3 refills | Status: AC

## 2017-04-27 NOTE — Progress Notes (Signed)
Patient ID: Tasha Aguilar, female    DOB: 06/13/1965, 52 y.o.   MRN: [id number]  Chief Complaint  Patient presents with  . Follow-up    Allergies Latex  Subjective:   Tasha Aguilar is a 52 y.o. female who presents to [facility] today.  HPI Tasha Aguilar presents today for follow-up visit.  She has been taking all of her blood pressure medications.  She did not have any side effects with increasing the dose of lisinopril and HCTZ.  She reports that when she checks her blood pressure at home it is still running in the 150s over 80s.  She did not bring her cuff in today.  She reports that her blood pressure has not been controlled in many years.  She has been on Toprol-XL in the past and did not have any side effects.  She denies any chest pain, shortness of breath, palpitations or swelling in her extremities.  She took her blood pressure medicines approximately 2 hours ago.  She did quit smoking on 04/21/2017.  She is taking the chantix bid. No side effects Chantix. Mood is good.  Denies any cravings for cigarettes.  Reports that she feels better about her overall health now that she is not smoking.  Has been a total of 6 days since she quit.  He is taking her cholesterol medications.  Denies any myalgias or side effects.  Denies any hypoglycemic episodes.  Reports she has been consistent with the metformin 1000 mg twice a day and the glipizide XL 5 mg a day.  Her blood sugars are running in the range of 10 5-2 30.  She reports that she does not always wait 2 hours after eating to check her sugar.  Brings in a record of her sugars today.  She reports that the past couple days her sugars have been running higher because she has been eating a lot of candy.  She reports that over the past week or so her mood has been somewhat down due to the fact that she just buried her brother in [place].  She is not interested in taking any medication for her mood.  She reports that she believes it  is just situation related and she will start to feel better.  She denies any suicidal or homicidal ideation.  She has no history of mental disorder in the past.  She is sleeping fairly well.  Appetite is good.  Energy is pretty good.  She reports that when she starts to feel down she usually does not follow a good diet.  She reports that she went out several days ago and bought a big bag of candy in her purse has proceeded to eat it.  She denies any lesions on her feet.  She reports that she has a history of ingrown toenails and was supposed to get surgery for this prior to moving but she did not have it performed.  She reports that her big toes gets sore on the lateral sides.  She denies any redness, draining, or sores on her toes.  She tries to wear shoes that do not bother her big toe.  Any numbness or tingling in her feet.  She has a history of a left-sided total knee replacement secondary to arthritis.  She does walk with a cane.  Prior to moving to this area had been followed by orthopedics and was told that she most likely needed a knee replacement of her right knee.  She  would like a referral to orthopedics at this time.     Past Medical History:  Diagnosis Date  . AMI (acute myocardial infarction) (HCC)   . Diabetes mellitus without complication (HCC)   . Hyperlipidemia   . Hypertension     Past Surgical History:  Procedure Laterality Date  . ABDOMINAL HYSTERECTOMY     due to menorrhagia  . APPENDECTOMY    . CESAREAN SECTION    . CHOLECYSTECTOMY    . JOINT REPLACEMENT     total left knee  . LUNG BIOPSY      Family History  Problem Relation Age of Onset  . Diabetes Mother   . Glaucoma Mother   . Hypertension Mother   . Hypertension Father   . Diabetes Father   . Heart disease Father   . Glaucoma Father   . Throat cancer Father   . Kidney disease Brother   . Diabetes Brother   . Hypertension Brother   . Hypertension Brother   . Diabetes Brother      Social History     Socioeconomic History  . Marital status: Single    Spouse name: Not on file  . Number of children: 4  . Years of education: 412  . Highest education level: 12th grade  Occupational History  . Not on file  Social Needs  . Financial resource strain: Not on file  . Food insecurity:    Worry: Not on file    Inability: Not on file  . Transportation needs:    Medical: Not on file    Non-medical: Not on file  Tobacco Use  . Smoking status: Current Every Day Smoker    Packs/day: 1.00    Years: 34.00    Pack years: 34.00  . Smokeless tobacco: Never Used  Substance and Sexual Activity  . Alcohol use: No    Frequency: Never  . Drug use: No  . Sexual activity: Yes    Birth control/protection: Surgical  Lifestyle  . Physical activity:    Days per week: Not on file    Minutes per session: Not on file  . Stress: Not on file  Relationships  . Social connections:    Talks on phone: Not on file    Gets together: Not on file    Attends religious service: Not on file    Active member of club or organization: Not on file    Attends meetings of clubs or organizations: Not on file    Relationship status: Not on file  Other Topics Concern  . Not on file  Social History Narrative   Lives in [place], single but lives with man. Has children, 4. Grew up in [place] and moved to [place] in [place]. Moved to [place] in 2018.    Enjoys movies, tv.    Eats all food groups.   Wear seat belt.   Drives.    Does not go to church.    Current Outpatient Medications on File Prior to Visit  Medication Sig Dispense Refill  . albuterol (PROAIR HFA) 108 (90 Base) MCG/ACT inhaler Inhale into the lungs every 6 (six) hours as needed for wheezing or shortness of breath.    Marland Kitchen. amLODipine (NORVASC) 10 MG tablet Take 1 tablet (10 mg total) by mouth daily. 90 tablet 1  . atorvastatin (LIPITOR) 40 MG tablet Take 1 tablet (40 mg total) by mouth daily. 90 tablet 1  . diclofenac sodium (VOLTAREN) 1 % GEL Apply topically 4  (four) times daily.    .Marland Kitchen  fenofibrate (TRICOR) 145 MG tablet Take 1 tablet (145 mg total) by mouth daily. 90 tablet 0  . hydrochlorothiazide (HYDRODIURIL) 25 MG tablet Take 1 tablet (25 mg total) by mouth daily. 90 tablet 3  . lisinopril (PRINIVIL,ZESTRIL) 40 MG tablet Take 1 tablet (40 mg total) by mouth daily. 90 tablet 3  . metFORMIN (GLUCOPHAGE) 1000 MG tablet Take 1,000 mg by mouth 2 (two) times daily.    . montelukast (SINGULAIR) 10 MG tablet Take 10 mg by mouth at bedtime.    . varenicline (CHANTIX STARTING MONTH PAK) 0.5 MG X 11 & 1 MG X 42 tablet Take one 0.5 mg tablet by mouth once daily for 3 days, then increase to one 0.5 mg tablet twice daily for 4 days, then increase to one 1 mg tablet twice daily. 53 tablet 0  . Vitamin D, Ergocalciferol, (DRISDOL) 50000 units CAPS capsule Take 1 capsule (50,000 Units total) by mouth every 7 (seven) days. 12 capsule 1   No current facility-administered medications on file prior to visit.     Review of Systems   Objective:   BP (!) 150/82 (BP Location: Left Arm, Patient Position: Sitting, Cuff Size: Normal)   Pulse 82   Temp 98.1 F (36.7 C) (Temporal)   Resp 16   Ht 5\' 6"  (1.676 m)   Wt 188 lb 4 oz (85.4 kg)   SpO2 99%   BMI 30.38 kg/m   Physical Exam  Constitutional: She is oriented to person, place, and time. She appears well-developed and well-nourished. No distress.  HENT:  Head: Normocephalic and atraumatic.  Eyes: Pupils are equal, round, and reactive to light.  Neck: Normal range of motion. Neck supple. No thyromegaly present.  Cardiovascular: Normal rate, regular rhythm and normal heart sounds.  Pulses:      Dorsalis pedis pulses are 2+ on the right side, and 2+ on the left side.       Posterior tibial pulses are 2+ on the right side, and 2+ on the left side.  Pulmonary/Chest: Effort normal and breath sounds normal. No respiratory distress.  Feet:  Right Foot:  Protective Sensation: 7 sites tested. 7 sites sensed.  Skin  Integrity: Negative for ulcer, blister, skin breakdown, erythema, warmth or callus.  Left Foot:  Protective Sensation: 7 sites tested. 7 sites sensed.  Skin Integrity: Negative for ulcer, blister, skin breakdown, erythema, warmth or callus.  Neurological: She is alert and oriented to person, place, and time. No cranial nerve deficit.  Skin: Skin is warm and dry.  Nursing note and vitals reviewed.    Assessment and Plan  1. Essential hypertension Continue current medications.  Add Toprol-XL at this time.  In cuff and next visit. Lifestyle modifications discussed with patient including a diet emphasizing vegetables, fruits, and whole grains. Limiting intake of sodium to less than 2,400 mg per day.  Recommendations discussed include consuming low-fat dairy products, poultry, fish, legumes, non-tropical vegetable oils, and nuts; and limiting intake of sweets, sugar-sweetened beverages, and red meat. Discussed following a plan such as the Dietary Approaches to Stop Hypertension (DASH) diet. Patient to read up on this diet.  Check BP and call with problems.  - metoprolol succinate (TOPROL-XL) 50 MG 24 hr tablet; Take 1 tablet (50 mg total) by mouth daily. Take with or immediately following a meal.  Dispense: 90 tablet; Refill: 3  2. Hyperlipidemia LDL goal <70 Continue current medications.  Plan on rechecking in 2 months.  3. Type 2 diabetes mellitus with diabetic neuropathy,  with long-term current use of insulin (HCC) Increase glipizide XL to 10 mg at this time. Foot exam performed. Dietary modifications discussed.  Goal A1c discussed. - glipiZIDE (GLIPIZIDE XL) 10 MG 24 hr tablet; Take 1 tablet (10 mg total) by mouth daily with breakfast.  Dispense: 90 tablet; Refill: 1  4. Primary osteoarthritis of right knee Referral placed. - Ambulatory referral to Orthopedic Surgery  5. Ingrown toenail Care discussed.  Referral placed. - Ambulatory referral to Podiatry  6.  History of tobacco  abuse Patient is to continue Chantix at this time. Congratulated on her tobacco cessation. We discussed the continuation of Chantix for smoking cessation. Discussed that serious neuropsychiatric adverse events have been reported in patients treated with this medication, including changes in mood, psychosis, hallucinations, paranoia, delusion, homicidal ideation, aggression, hostility, agitation, anxiety, suicide ideation, suicide attempt, and completed suicide. Advised patient that if taking this medication and any symptoms occur that the patient should STOP taking chantix and contact a healthcare provider via the ED or our office. Discussed that other common adverse reactions can occur with this medication such as nausea, abnormal dreams, constipation, gas, and vomiting.  Patient understands the risk of this medication and voiced understanding. Agrees to keep follow up appointments and follow the treatment plan.    Keep scheduled follow-up with diabetic education/nutrition. Keep scheduled follow-up with GI.  Cologuard testing results reviewed with patient. Return in about 1 month (around 05/25/2017) for BP. Aliene Beams, MD 04/27/2017

## 2017-04-27 NOTE — Patient Instructions (Signed)
Increase your glipizide xl to 10 mg a day with breakfast- You can take 2 of the 5 mg tablets at the same time until you run out of those.  I have sent in the glipizide xl 10 mg tablets to your pharmacy  GOOD JOB on quitting smoking. DO NOT START BACK.....seriously.    Referral to the podiatrist and orthopedic doctors placed.  ADD the toprol XL 50 mg a day for your BP Bring your BP cuff to the next visit so I can check it.

## 2017-05-02 ENCOUNTER — Ambulatory Visit (INDEPENDENT_AMBULATORY_CARE_PROVIDER_SITE_OTHER): Payer: Medicare Other | Admitting: Nurse Practitioner

## 2017-05-02 ENCOUNTER — Encounter: Payer: Self-pay | Admitting: Nurse Practitioner

## 2017-05-02 ENCOUNTER — Ambulatory Visit: Payer: Medicare Other | Admitting: Nutrition

## 2017-05-02 VITALS — BP 121/80 | HR 74 | Temp 98.8°F | Ht 66.0 in | Wt 188.2 lb

## 2017-05-02 DIAGNOSIS — K581 Irritable bowel syndrome with constipation: Secondary | ICD-10-CM

## 2017-05-02 DIAGNOSIS — K219 Gastro-esophageal reflux disease without esophagitis: Secondary | ICD-10-CM | POA: Diagnosis not present

## 2017-05-02 HISTORY — DX: Gastro-esophageal reflux disease without esophagitis: K21.9

## 2017-05-02 MED ORDER — DICYCLOMINE HCL 10 MG PO CAPS: 10.0000 mg | ORAL_CAPSULE | Freq: Three times a day (TID) | ORAL | 3 refills | Status: DC

## 2017-05-02 MED ORDER — OMEPRAZOLE 40 MG PO CPDR: 40.0000 mg | DELAYED_RELEASE_CAPSULE | Freq: Every day | ORAL | 3 refills | Status: DC

## 2017-05-02 NOTE — Progress Notes (Signed)
Primary Care Physician:  Aliene Beams, MD Primary Gastroenterologist:  Dr. Jena Gauss  Chief Complaint  Patient presents with  . Diarrhea    with anything she eats  . Gastroesophageal Reflux  . Abdominal Pain    sharp, stabbing pains whens he has diarrhea    HPI:   Tasha Aguilar is a 52 y.o. female who presents on referral from primary care for IBS symptoms and constipation.  Reviewed office visit associated with referral, which is dated 02/18/2017.  At that time she was establishing care and has not had a primary care in some time.  At that time she indicated chronic history of IBS and has typically been followed by gastroenterology.  She has previously been on prescription medicine for her IBS and she would like referral to GI.  She states she is due to have colonoscopy performed but she would like to have testing done that she "saw on the television" (sounds like Cologuard).  Last colonoscopy over 10 years ago, no family history of colon cancer.  No personal history of polyps or IBD.  No overt red flag/warning signs or symptoms at that time including no bright red blood per rectum, melena, hematochezia.  Primary care ordered Cologuard testing for the patient.  She was referred to our office for IBS symptoms.  Cologuard testing was completed 03/14/2017 and resulted negative.  Today she states she's doing ok overall. She notes she has chronic IBS-D (diarrhea type) and NOT constipation type. Not currently on medication for this. Has had IBS-D for a number of years. She was previously on Viberzi which she states worked well for her. She does not have a gallbladder (and was unaware for black box warning for Viberzi s/p cholecystectomy). She has sharp, stabbing abdominal pain associated with diarrhea; also gas and bloating. Pain improves after bowel movement. She has diarrhea after every meal. Consumes minimal dairy (a little with cereal, no cheese, yogurt, ice cream). Denies hematochezia, melena,  unintentional weight loss, fever, chills. She is ok with colonoscopy. Last colonoscopy about 2014 (5 years ago), told everything was normal. We will try to request reports. Has GERD symptoms, chronic for a number of years. Has daily symptoms. Not on any medication. Previously on Protonix/Ranitadine which didn't work well. Did take a different PPI (she thinks omeprazole?) which worked better. Denies chest pain, dyspnea, dizziness, lightheadedness, syncope, near syncope. Denies any other upper or lower GI symptoms.  Past Medical History:  Diagnosis Date  . AMI (acute myocardial infarction) (HCC) 2006   "mild"  . Diabetes mellitus without complication (HCC)   . Hyperlipidemia   . Hypertension     Past Surgical History:  Procedure Laterality Date  . ABDOMINAL HYSTERECTOMY     due to menorrhagia  . APPENDECTOMY    . CESAREAN SECTION    . CHOLECYSTECTOMY    . JOINT REPLACEMENT     total left knee  . LUNG BIOPSY      Current Outpatient Medications  Medication Sig Dispense Refill  . albuterol (PROAIR HFA) 108 (90 Base) MCG/ACT inhaler Inhale into the lungs every 6 (six) hours as needed for wheezing or shortness of breath.    Marland Kitchen amLODipine (NORVASC) 10 MG tablet Take 1 tablet (10 mg total) by mouth daily. 90 tablet 1  . atorvastatin (LIPITOR) 40 MG tablet Take 1 tablet (40 mg total) by mouth daily. 90 tablet 1  . diclofenac sodium (VOLTAREN) 1 % GEL Apply topically 4 (four) times daily.    . fenofibrate (  TRICOR) 145 MG tablet Take 1 tablet (145 mg total) by mouth daily. 90 tablet 0  . glipiZIDE (GLIPIZIDE XL) 10 MG 24 hr tablet Take 1 tablet (10 mg total) by mouth daily with breakfast. 90 tablet 1  . hydrochlorothiazide (HYDRODIURIL) 25 MG tablet Take 1 tablet (25 mg total) by mouth daily. 90 tablet 3  . lisinopril (PRINIVIL,ZESTRIL) 40 MG tablet Take 1 tablet (40 mg total) by mouth daily. 90 tablet 3  . metFORMIN (GLUCOPHAGE) 1000 MG tablet Take 1,000 mg by mouth 2 (two) times daily.    .  metoprolol succinate (TOPROL-XL) 50 MG 24 hr tablet Take 1 tablet (50 mg total) by mouth daily. Take with or immediately following a meal. 90 tablet 3  . montelukast (SINGULAIR) 10 MG tablet Take 10 mg by mouth at bedtime.    . varenicline (CHANTIX STARTING MONTH PAK) 0.5 MG X 11 & 1 MG X 42 tablet Take one 0.5 mg tablet by mouth once daily for 3 days, then increase to one 0.5 mg tablet twice daily for 4 days, then increase to one 1 mg tablet twice daily. 53 tablet 0  . Vitamin D, Ergocalciferol, (DRISDOL) 50000 units CAPS capsule Take 1 capsule (50,000 Units total) by mouth every 7 (seven) days. 12 capsule 1   No current facility-administered medications for this visit.     Allergies as of 05/02/2017 - Review Complete 05/02/2017  Allergen Reaction Noted  . Latex Other (See Comments) 02/18/2017    Family History  Problem Relation Age of Onset  . Diabetes Mother   . Glaucoma Mother   . Hypertension Mother   . Hypertension Father   . Diabetes Father   . Heart disease Father   . Glaucoma Father   . Throat cancer Father   . Kidney disease Brother   . Diabetes Brother   . Hypertension Brother   . Hypertension Brother   . Diabetes Brother   . Colon cancer Neg Hx   . Gastric cancer Neg Hx   . Esophageal cancer Neg Hx     Social History   Socioeconomic History  . Marital status: Single    Spouse name: Not on file  . Number of children: 4  . Years of education: 70  . Highest education level: 12th grade  Occupational History  . Not on file  Social Needs  . Financial resource strain: Not on file  . Food insecurity:    Worry: Not on file    Inability: Not on file  . Transportation needs:    Medical: Not on file    Non-medical: Not on file  Tobacco Use  . Smoking status: Current Every Day Smoker    Packs/day: 1.00    Years: 34.00    Pack years: 34.00    Types: Cigarettes  . Smokeless tobacco: Never Used  Substance and Sexual Activity  . Alcohol use: No    Frequency:  Never  . Drug use: No  . Sexual activity: Yes    Birth control/protection: Surgical  Lifestyle  . Physical activity:    Days per week: Not on file    Minutes per session: Not on file  . Stress: Not on file  Relationships  . Social connections:    Talks on phone: Not on file    Gets together: Not on file    Attends religious service: Not on file    Active member of club or organization: Not on file    Attends meetings of clubs or  organizations: Not on file    Relationship status: Not on file  . Intimate partner violence:    Fear of current or ex partner: Not on file    Emotionally abused: Not on file    Physically abused: Not on file    Forced sexual activity: Not on file  Other Topics Concern  . Not on file  Social History Narrative   Lives in [place], single but lives with man. Has children, 4. Grew up in [place] and moved to [place] in 2017. Moved to [place] in 2018.    Enjoys movies, tv.    Eats all food groups.   Wear seat belt.   Drives.    Does not go to church.     Review of Systems: General: Negative for anorexia, weight loss, fever, chills, fatigue, weakness. ENT: Negative for hoarseness, difficulty swallowing , nasal congestion. CV: Negative for chest pain, angina, palpitations, peripheral edema.  Respiratory: Negative for dyspnea at rest, cough, sputum, wheezing.  GI: See history of present illness. MS: Admits chronic joint pain.  Derm: Negative for rash or itching.  Endo: Negative for unusual weight change.  Heme: Negative for bruising or bleeding. Allergy: Negative for rash or hives.    Physical Exam: BP 121/80   Pulse 74   Temp 98.8 F (37.1 C) (Oral)   Ht  (1.676 m)   Wt 188 lb 3.2 oz (85.4 kg)   BMI 30.38 kg/m  General:   Alert and oriented. Pleasant and cooperative. Well-nourished and well-developed.  Eyes:  Without icterus, sclera clear and conjunctiva pink.  Ears:  Normal auditory acuity. Cardiovascular:  S1, S2 present without murmurs  appreciated. Extremities without clubbing or edema. Respiratory:  Clear to auscultation bilaterally. No wheezes, rales, or rhonchi. No distress.  Gastrointestinal:  +BS, soft, non-tender and non-distended. No HSM noted. No guarding or rebound. No masses appreciated.  Rectal:  Deferred  Musculoskalatal:  Symmetrical without gross deformities. Skin:  Intact without significant lesions or rashes. Neurologic:  Alert and oriented x4;  grossly normal neurologically. Psych:  Alert and cooperative. Normal mood and affect. Heme/Lymph/Immune: No excessive bruising noted.    05/02/2017 11:53 AM   Disclaimer: This note was dictated with voice recognition software. Similar sounding words can inadvertently be transcribed and may not be corrected upon review.

## 2017-05-02 NOTE — Patient Instructions (Signed)
1. I sent in omeprazole 40 mg to your pharmacy.  Take this once a day, on an empty stomach to help your heartburn. 2. I have sent in Bentyl 10 mg (dicyclomine).  Take this 3 times a day with meals. 3. We will try to request your previous colonoscopy report from [place]. 4. Call us in 2 to 4 weeks and let us know if your medications are helping your symptoms. 5. Return for follow-up in 2 months. 6. Call us if you have any questions or concerns.  At [facility] we value your feedback. You may receive a survey about your visit today. Please share your experience as we strive to create trusting relationships with our patients to provide genuine, compassionate, quality care.  It was great to meet you today!  I hope you have a wonderful summer!!

## 2017-05-02 NOTE — Assessment & Plan Note (Signed)
Chronic history of IBS diarrhea type.  She was previously on Viberzi which worked well.  However, since then the black box warning has been issued.  She has had her gallbladder out.  She is not a candidate for Viberzi any further.  We will trial dicyclomine 10 mg 3 times a day with meals given that she has her symptoms with every meal.  Minimal dairy intake which makes lactose intolerance unlikely.  She continues to have symptoms we can test her for celiac disease as well.  Follow-up in 2 months.  Call with progress report on symptom progression on Bentyl in 2 to 4 weeks.

## 2017-05-02 NOTE — Progress Notes (Signed)
CC'D TO PCP °

## 2017-05-02 NOTE — Assessment & Plan Note (Signed)
Chronic history of GERD.  Protonix previously was not effective.  Prilosec helped better.  At this point I will restart her on Prilosec 40 mg daily.  Progress report in 2 to 4 weeks.  She has symptoms every day.  Hopefully we can get her better symptomatically controlled on PPI.  Depending on her response to Prilosec we can consider other PPIs including AcipHex, Nexium, Dexilant.  Follow-up in 2 months.

## 2017-05-04 ENCOUNTER — Encounter (INDEPENDENT_AMBULATORY_CARE_PROVIDER_SITE_OTHER): Payer: Self-pay | Admitting: Orthopaedic Surgery

## 2017-05-04 ENCOUNTER — Ambulatory Visit (INDEPENDENT_AMBULATORY_CARE_PROVIDER_SITE_OTHER): Payer: Medicare Other | Admitting: Orthopaedic Surgery

## 2017-05-04 ENCOUNTER — Ambulatory Visit (INDEPENDENT_AMBULATORY_CARE_PROVIDER_SITE_OTHER): Payer: Self-pay

## 2017-05-04 VITALS — BP 147/93 | HR 72 | Resp 16 | Ht 66.0 in | Wt 188.0 lb

## 2017-05-04 DIAGNOSIS — G8929 Other chronic pain: Secondary | ICD-10-CM | POA: Diagnosis not present

## 2017-05-04 DIAGNOSIS — M25561 Pain in right knee: Secondary | ICD-10-CM

## 2017-05-04 NOTE — Progress Notes (Signed)
Office Visit Note   Patient: Tasha Aguilar           Date of Birth: 04-24-1965           MRN: [id number] Visit Date: 05/04/2017              Requested by: Aliene Beams, MD [address] [place], [place] [address] PCP: Aliene Beams, MD   Assessment & Plan: Visit Diagnoses:  1. Chronic pain of right knee     Plan: Mild osteoarthritis right knee with minimal symptoms.  Also chronic history of low back pain.  Long discussion regarding both of the above with Tasha Aguilar.  No specific treatment for the right knee at this point as she had minimal symptoms.  Suggest evaluation by an surgeon for chronic low back pain relating to her disability status will see back as needed.  Office visit over 45 minutes with over 50% of the time counseling regarding diagnosis and treatment options  Follow-Up Instructions: Return if symptoms worsen or fail to improve.   Orders:  Orders Placed This Encounter  Procedures  . XR KNEE 3 VIEW RIGHT   No orders of the defined types were placed in this encounter.     Procedures: No procedures performed   Clinical Data: No additional findings.   Subjective: No chief complaint on file. Tasha Aguilar recently moved to [place] from [place].  He is on disability as a result of chronic low back pain.  She is also had a prior left total knee replacement in 2014 and is experiencing some pain in her right knee.  The left knee is asymptomatic.  Difficulty for over 4 years with the right knee associated with recurrent sharp pain particularly medially.  Occasionally she will have some altered sensibility along the lateral aspect of her knee no further proximally or distally.  Occasion she will leave it have some swelling of her knee.  Treatment.  She does occasionally use a cane.  She will use ice and heat and occasionally a brace and Voltaren gel.  No recent injury or trauma.  No prior right knee surgery  HPI  Review of Systems  Constitutional: Negative for  fatigue.  HENT: Negative for ear pain.   Eyes: Negative for pain.  Respiratory: Negative for cough and shortness of breath.   Cardiovascular: Negative for leg swelling.  Gastrointestinal: Negative for constipation and diarrhea.  Genitourinary: Negative for difficulty urinating.  Musculoskeletal: Negative for neck pain.  Skin: Negative for rash.  Allergic/Immunologic: Negative for food allergies.  Neurological: Positive for weakness and numbness.  Hematological: Does not bruise/bleed easily.  Psychiatric/Behavioral: Negative for sleep disturbance.     Objective: Vital Signs: There were no vitals taken for this visit.  Physical Exam  Constitutional: She is oriented to person, place, and time. She appears well-developed and well-nourished.  HENT:  Mouth/Throat: Oropharynx is clear and moist.  Eyes: Pupils are equal, round, and reactive to light. EOM are normal.  Pulmonary/Chest: Effort normal.  Neurological: She is alert and oriented to person, place, and time.  Skin: Skin is warm and dry.  Psychiatric: She has a normal mood and affect. Her behavior is normal.    Ortho Exam alert and oriented x3.  Comfortable sitting.  Knee with well-healed anterior incision for her knee replacement.  The knee was not hot warm red or uncomfortable.  Right knee with very minimal effusion.  Some mild diffuse medial joint pain.  Minimal patellar crepitation.  Hyper extension  without pain.  Flexed over 110 degrees.  No opening with varus or valgus stress.  Negative anterior drawer sign.  Edema.  Neurovascular exam intact.  Straight leg raise negative today.  No pain with range of motion of right hip.  Specialty Comments:  No specialty comments available.  Imaging: Xr Knee 3 View Right  Result Date: 05/04/2017 Films of the right knee were obtained in several projections standing.  There is approximately 2 degrees of varus.  Slight decrease in the medial joint space.  Very minimal ectopic calcification  along the superior medial femoral condyle possibly consistent with an old MCL injury.  Otherwise no ectopic calcification.  Patella tracks in the midline.  Appears to have hyperextension of the knee    PMFS History: Patient Active Problem List   Diagnosis Date Noted  . GERD (gastroesophageal reflux disease) 05/02/2017  . Tobacco abuse counseling 04/27/2017  . Obesity (BMI 30.0-34.9) 04/27/2017  . Ingrown toenail 04/27/2017  . Primary osteoarthritis of right knee 04/27/2017  . Essential hypertension 02/19/2017  . Hyperlipidemia LDL goal <70 02/19/2017  . Type 2 diabetes mellitus with diabetic neuropathy, with long-term current use of insulin (HCC) 02/19/2017  . Bronchitis 02/19/2017  . Screening for breast cancer 02/19/2017  . Immunization due 02/19/2017  . Screen for colon cancer 02/19/2017  . Vitamin D deficiency 02/19/2017  . Fatigue 02/19/2017  . Irritable bowel syndrome with constipation 02/19/2017   Past Medical History:  Diagnosis Date  . AMI (acute myocardial infarction) (HCC) 2006   "mild"  . Diabetes mellitus without complication (HCC)   . Hyperlipidemia   . Hypertension     Family History  Problem Relation Age of Onset  . Diabetes Mother   . Glaucoma Mother   . Hypertension Mother   . Hypertension Father   . Diabetes Father   . Heart disease Father   . Glaucoma Father   . Throat cancer Father   . Kidney disease Brother   . Diabetes Brother   . Hypertension Brother   . Hypertension Brother   . Diabetes Brother   . Colon cancer Neg Hx   . Gastric cancer Neg Hx   . Esophageal cancer Neg Hx     Past Surgical History:  Procedure Laterality Date  . ABDOMINAL HYSTERECTOMY     due to menorrhagia  . APPENDECTOMY    . CESAREAN SECTION    . CHOLECYSTECTOMY    . JOINT REPLACEMENT     total left knee  . LUNG BIOPSY     Social History   Occupational History  . Not on file  Tobacco Use  . Smoking status: Current Every Day Smoker    Packs/day: 1.00     Years: 34.00    Pack years: 34.00    Types: Cigarettes  . Smokeless tobacco: Never Used  Substance and Sexual Activity  . Alcohol use: No    Frequency: Never  . Drug use: No  . Sexual activity: Yes    Birth control/protection: Surgical     Valeria Batman, MD   Note - This record has been created using [employer].  Chart creation errors have been sought, but may not always  have been located. Such creation errors do not reflect on  the standard of medical care.

## 2017-05-10 ENCOUNTER — Encounter: Payer: Medicare Other | Attending: Family Medicine | Admitting: Nutrition

## 2017-05-10 ENCOUNTER — Encounter: Payer: Self-pay | Admitting: Nutrition

## 2017-05-10 VITALS — Ht 66.0 in | Wt 189.0 lb

## 2017-05-10 DIAGNOSIS — Z713 Dietary counseling and surveillance: Secondary | ICD-10-CM | POA: Diagnosis not present

## 2017-05-10 DIAGNOSIS — IMO0002 Reserved for concepts with insufficient information to code with codable children: Secondary | ICD-10-CM

## 2017-05-10 DIAGNOSIS — E1165 Type 2 diabetes mellitus with hyperglycemia: Secondary | ICD-10-CM

## 2017-05-10 DIAGNOSIS — E119 Type 2 diabetes mellitus without complications: Secondary | ICD-10-CM | POA: Diagnosis not present

## 2017-05-10 DIAGNOSIS — E118 Type 2 diabetes mellitus with unspecified complications: Secondary | ICD-10-CM

## 2017-05-10 NOTE — Patient Instructions (Signed)
Goals 1. Follow MY Plate 2. Eat three meals per day 3. Don't skip meals. 4.Cut out sugared cereal, sweets and  Junk food. 5. Increase watking to 5 10 minutes a day. Test blood sugars before meals x 1 week  Get A1C down to 7%.

## 2017-05-10 NOTE — Progress Notes (Signed)
Lab Results  Component Value Date   HGBA1C 10.0 (H) 02/18/2017   Diabetes Self-Management Education  Visit Type: First/Initial  Appt. Start Time: 0900 Appt. End Time: 1030  05/10/2017  [name], identified by name and date of birth, is a 52 y.o. female with a diagnosis of Diabetes: Type 2. Sees Dr. Tracie Harrier for PCP. CMP Latest Ref Rng & Units 03/23/2017 02/18/2017  Glucose 65 - 99 mg/dL - 66  BUN 7 - 25 mg/dL - 15  Creatinine 4.09 - 1.05 mg/dL - 8.11(B)  Sodium 147 - 146 mmol/L - 143  Potassium 3.5 - 5.3 mmol/L - 4.3  Chloride 98 - 110 mmol/L - 109  CO2 20 - 32 mmol/L - 27  Calcium 8.6 - 10.4 mg/dL 9.7 10.7(H)  Total Protein 6.1 - 8.1 g/dL - 7.7  Total Bilirubin 0.2 - 1.2 mg/dL - 0.4  AST 10 - 35 U/L - 18  ALT 6 - 29 U/L - 17   Lipid Panel     Component Value Date/Time   CHOL 173 03/23/2017 0859   TRIG 110 03/23/2017 0859   HDL 35 (L) 03/23/2017 0859   CHOLHDL 4.9 03/23/2017 0859   LDLCALC 116 (H) 03/23/2017 0859      ASSESSMENT  Height  (1.676 m), weight 189 lb (85.7 kg). Body mass index is 30.51 kg/m.  Diabetes Self-Management Education - 05/10/17 0917      Visit Information   Visit Type  First/Initial      Initial Visit   Diabetes Type  Type 2    Are you currently following a meal plan?  No    Are you taking your medications as prescribed?  Yes    Date Diagnosed  2014      Health Coping   How would you rate your overall health?  Good      Psychosocial Assessment   Patient Belief/Attitude about Diabetes  Motivated to manage diabetes    Self-care barriers  None    Self-management support  Doctor's office;Family    Other persons present  Patient    Patient Concerns  Healthy Lifestyle;Nutrition/Meal planning;Glycemic Control;Medication;Weight Control    Special Needs  None    Learning Readiness  Ready    How often do you need to have someone help you when you read instructions, pamphlets, or other written materials from your doctor or pharmacy?   1 - Never    What is the last grade level you completed in school?  11      Pre-Education Assessment   Patient understands the diabetes disease and treatment process.  Needs Review    Patient understands incorporating nutritional management into lifestyle.  Needs Review    Patient undertands incorporating physical activity into lifestyle.  Needs Review    Patient understands using medications safely.  Needs Review    Patient understands monitoring blood glucose, interpreting and using results  Needs Review    Patient understands prevention, detection, and treatment of acute complications.  Needs Review    Patient understands prevention, detection, and treatment of chronic complications.  Needs Review    Patient understands how to develop strategies to address psychosocial issues.  Needs Review    Patient understands how to develop strategies to promote health/change behavior.  Needs Review      Complications   Last HgB A1C per patient/outside source  10 %    How often do you check your blood sugar?  1-2 times/day    Fasting Blood glucose range (  mg/dL)  16-109    Number of hypoglycemic episodes per month  5    Can you tell when your blood sugar is low?  Yes    What do you do if your blood sugar is low?  take glucose tabs or eat someting    Number of hyperglycemic episodes per week  5    Can you tell when your blood sugar is high?  Yes    What do you do if your blood sugar is high?  take meds     Have you had a dilated eye exam in the past 12 months?  Yes    Have you had a dental exam in the past 12 months?  Yes    Are you checking your feet?  No      Dietary Intake   Breakfast  Bowl of [employer], whole milk    Snack (morning)  water    Lunch  skipped water    Snack (afternoon)  water    [employer], peas, chicken,  Lemonade    Beverage(s)  Diet sodas, water, lemonade      Exercise   Exercise Type  ADL's      Patient Education   Previous Diabetes Education  No    Disease  state   Factors that contribute to the development of diabetes;Explored patient's options for treatment of their diabetes    Nutrition management   Role of diet in the treatment of diabetes and the relationship between the three main macronutrients and blood glucose level;Meal timing in regards to the patients' current diabetes medication.;Carbohydrate counting;Food label reading, portion sizes and measuring food.    Physical activity and exercise   Role of exercise on diabetes management, blood pressure control and cardiac health.    Medications  Taught/reviewed insulin injection, site rotation, insulin storage and needle disposal.    Monitoring  Taught/evaluated SMBG meter.;Taught/discussed recording of test results and interpretation of SMBG.;Interpreting lab values - A1C, lipid, urine microalbumina.;Identified appropriate SMBG and/or A1C goals.    Acute complications  Trained/discussed glucagon administration to patient and designated other.    Chronic complications  Relationship between chronic complications and blood glucose control    Psychosocial adjustment  Worked with patient to identify barriers to care and solutions    Personal strategies to promote health  Lifestyle issues that need to be addressed for better diabetes care      Individualized Goals (developed by patient)   Nutrition  Follow meal plan discussed    Physical Activity  Exercise 3-5 times per week;15 minutes per day    Medications  take my medication as prescribed    Monitoring   test my blood glucose as discussed    Reducing Risk  examine blood glucose patterns;get labs drawn      Post-Education Assessment   Patient understands the diabetes disease and treatment process.  Needs Review    Patient understands incorporating nutritional management into lifestyle.  Needs Review    Patient undertands incorporating physical activity into lifestyle.  Needs Review    Patient understands using medications safely.  Needs Review     Patient understands monitoring blood glucose, interpreting and using results  Needs Review    Patient understands prevention, detection, and treatment of acute complications.  Needs Review    Patient understands prevention, detection, and treatment of chronic complications.  Needs Review    Patient understands how to develop strategies to address psychosocial issues.  Needs Review    Patient  understands how to develop strategies to promote health/change behavior.  Needs Review      Outcomes   Expected Outcomes  Demonstrated interest in learning. Expect positive outcomes    Future DMSE  4-6 wks    Program Status  Completed       Individualized Plan for Diabetes Self-Management Training:   Learning Objective:  Patient will have a greater understanding of diabetes self-management. Patient education plan is to attend individual and/or group sessions per assessed needs and concerns.   Plan:   Patient Instructions  Goals 1. Follow MY Plate 2. Eat three meals per day 3. Don't skip meals. 4.Cut out sugared cereal, sweets and  Junk food. 5. Increase watking to 5 10 minutes a day. Test blood sugars before meals x 1 week  Get A1C down to 7%.     Expected Outcomes:  Demonstrated interest in learning. Expect positive outcomes  Education material provided: Living Well with Diabetes, Food label handouts, A1C conversion sheet, Meal plan card, My Plate and Carbohydrate counting sheet  If problems or questions, patient to contact team via:  Phone and Email  Future DSME appointment: 4-6 wks

## 2017-05-12 ENCOUNTER — Encounter: Payer: Self-pay | Admitting: Podiatry

## 2017-05-12 ENCOUNTER — Ambulatory Visit (INDEPENDENT_AMBULATORY_CARE_PROVIDER_SITE_OTHER): Payer: Medicare Other | Admitting: Podiatry

## 2017-05-12 VITALS — BP 127/82 | HR 72

## 2017-05-12 DIAGNOSIS — L603 Nail dystrophy: Secondary | ICD-10-CM | POA: Diagnosis not present

## 2017-05-12 DIAGNOSIS — L600 Ingrowing nail: Secondary | ICD-10-CM | POA: Diagnosis not present

## 2017-05-12 DIAGNOSIS — B351 Tinea unguium: Secondary | ICD-10-CM

## 2017-05-12 DIAGNOSIS — L601 Onycholysis: Secondary | ICD-10-CM | POA: Diagnosis not present

## 2017-05-12 NOTE — Patient Instructions (Signed)

## 2017-05-16 NOTE — Progress Notes (Signed)
Subjective:   Patient ID: Tasha Aguilar, female   DOB: 52 y.o.   MRN: [id number]   HPI 52 year old female presenting today for concerns of ingrown toenails to both of her big toes on both nail corners been ongoing for several years and the toes are painful with pressure in shoes.  Denies any drainage or pus or any redness to the toes.  She is diabetic her last A1c was 10.  She has no other concerns today.   Review of Systems  All other systems reviewed and are negative.  Past Medical History:  Diagnosis Date  . AMI (acute myocardial infarction) (HCC) 2006   "mild"  . Diabetes mellitus without complication (HCC)   . Hyperlipidemia   . Hypertension     Past Surgical History:  Procedure Laterality Date  . ABDOMINAL HYSTERECTOMY     due to menorrhagia  . APPENDECTOMY    . CESAREAN SECTION    . CHOLECYSTECTOMY    . JOINT REPLACEMENT     total left knee  . LUNG BIOPSY       Current Outpatient Medications:  .  albuterol (PROAIR HFA) 108 (90 Base) MCG/ACT inhaler, Inhale into the lungs every 6 (six) hours as needed for wheezing or shortness of breath., Disp: , Rfl:  .  amLODipine (NORVASC) 10 MG tablet, Take 1 tablet (10 mg total) by mouth daily., Disp: 90 tablet, Rfl: 1 .  atorvastatin (LIPITOR) 40 MG tablet, Take 1 tablet (40 mg total) by mouth daily., Disp: 90 tablet, Rfl: 1 .  diclofenac sodium (VOLTAREN) 1 % GEL, Apply topically 4 (four) times daily., Disp: , Rfl:  .  dicyclomine (BENTYL) 10 MG capsule, Take 1 capsule (10 mg total) by mouth 3 (three) times daily before meals., Disp: 90 capsule, Rfl: 3 .  fenofibrate (TRICOR) 145 MG tablet, Take 1 tablet (145 mg total) by mouth daily., Disp: 90 tablet, Rfl: 0 .  glipiZIDE (GLIPIZIDE XL) 10 MG 24 hr tablet, Take 1 tablet (10 mg total) by mouth daily with breakfast., Disp: 90 tablet, Rfl: 1 .  hydrochlorothiazide (HYDRODIURIL) 25 MG tablet, Take 1 tablet (25 mg total) by mouth daily., Disp: 90 tablet, Rfl: 3 .  lisinopril  (PRINIVIL,ZESTRIL) 40 MG tablet, Take 1 tablet (40 mg total) by mouth daily., Disp: 90 tablet, Rfl: 3 .  metFORMIN (GLUCOPHAGE) 1000 MG tablet, Take 1,000 mg by mouth 2 (two) times daily., Disp: , Rfl:  .  metoprolol succinate (TOPROL-XL) 50 MG 24 hr tablet, Take 1 tablet (50 mg total) by mouth daily. Take with or immediately following a meal., Disp: 90 tablet, Rfl: 3 .  montelukast (SINGULAIR) 10 MG tablet, Take 10 mg by mouth at bedtime., Disp: , Rfl:  .  omeprazole (PRILOSEC) 40 MG capsule, Take 1 capsule (40 mg total) by mouth daily., Disp: 30 capsule, Rfl: 3 .  varenicline (CHANTIX STARTING MONTH PAK) 0.5 MG X 11 & 1 MG X 42 tablet, Take one 0.5 mg tablet by mouth once daily for 3 days, then increase to one 0.5 mg tablet twice daily for 4 days, then increase to one 1 mg tablet twice daily., Disp: 53 tablet, Rfl: 0 .  Vitamin D, Ergocalciferol, (DRISDOL) 50000 units CAPS capsule, Take 1 capsule (50,000 Units total) by mouth every 7 (seven) days., Disp: 12 capsule, Rfl: 1  Allergies  Allergen Reactions  . Latex Other (See Comments)    Blister    Social History   Socioeconomic History  . Marital status: Single  Spouse name: Not on file  . Number of children: 4  . Years of education: 79  . Highest education level: 12th grade  Occupational History  . Not on file  Social Needs  . Financial resource strain: Not on file  . Food insecurity:    Worry: Not on file    Inability: Not on file  . Transportation needs:    Medical: Not on file    Non-medical: Not on file  Tobacco Use  . Smoking status: Current Every Day Smoker    Packs/day: 1.00    Years: 34.00    Pack years: 34.00    Types: Cigarettes  . Smokeless tobacco: Never Used  Substance and Sexual Activity  . Alcohol use: No    Frequency: Never  . Drug use: No  . Sexual activity: Yes    Birth control/protection: Surgical  Lifestyle  . Physical activity:    Days per week: Not on file    Minutes per session: Not on file   . Stress: Not on file  Relationships  . Social connections:    Talks on phone: Not on file    Gets together: Not on file    Attends religious service: Not on file    Active member of club or organization: Not on file    Attends meetings of clubs or organizations: Not on file    Relationship status: Not on file  . Intimate partner violence:    Fear of current or ex partner: Not on file    Emotionally abused: Not on file    Physically abused: Not on file    Forced sexual activity: Not on file  Other Topics Concern  . Not on file  Social History Narrative   Lives in [place], single but lives with man. Has children, 4. Grew up in [place] and moved to [place] in 2017. Moved to [place] in 2018.    Enjoys movies, tv.    Eats all food groups.   Wear seat belt.   Drives.    Does not go to church.          Objective:  Physical Exam  General: AAO x3, NAD  Dermatological: There is incurvation present on both medial lateral aspects of bilateral hallux toenails and there is no edema, erythema, drainage or pus however there is tenderness palpation of the nail corners.  This is been a chronic issue for her.  There is no open lesions identified today.  Nails are hypertrophic, dystrophic with yellow discoloration as well as brown discoloration.  Vascular: Dorsalis Pedis artery and Posterior Tibial artery pedal pulses are 2/4 bilateral with immedate capillary fill time. There is no pain with calf compression, swelling, warmth, erythema.   Neruologic: Grossly intact via light touch bilateral. Protective threshold with Semmes Wienstein monofilament intact to all pedal sites bilateral.   Musculoskeletal: No gross boney pedal deformities bilateral. No pain, crepitus, or limitation noted with foot and ankle range of motion bilateral. Muscular strength 5/5 in all groups tested bilateral.  Gait: Unassisted, Nonantalgic.       Assessment:   Chronic bilateral hallux ingrown toenails, onychodystrophy     Plan:  -Treatment options discussed including all alternatives, risks, and complications -Etiology of symptoms were discussed -I do recommend a partial nail avulsion with chemical matricectomy to bilateral hallux toenails however I would recommend holding off on this as this right now given her A1c is 10.  There is no signs of infection.  I did sharply debride the  toenails today without any complications or bleeding to help relieve some discomfort.  She is going to be scheduled to get her A1c rechecked the next couple weeks.  I will see her back in 4 weeks and if her A1c is improved we will consider partial nail avulsions. -I did send the trimmings today for culture -Monitor for any clinical signs or symptoms of infection and directed to call the office immediately should any occur or go to the ER.  Vivi Barrack DPM

## 2017-05-27 ENCOUNTER — Ambulatory Visit: Payer: Medicare Other | Admitting: Family Medicine

## 2017-06-03 ENCOUNTER — Other Ambulatory Visit: Payer: Self-pay | Admitting: Family Medicine

## 2017-06-03 DIAGNOSIS — Z720 Tobacco use: Secondary | ICD-10-CM

## 2017-06-07 ENCOUNTER — Telehealth: Payer: Self-pay | Admitting: *Deleted

## 2017-06-07 NOTE — Telephone Encounter (Signed)
I informed pt of Dr. Gabriel RungWagoner's review of results and recommendation for the [id number] cost and use. Pt states she would like to purchase and I put one up front at reception.

## 2017-06-07 NOTE — Telephone Encounter (Signed)
-----   Message from Vivi BarrackMatthew R Wagoner, DPM sent at 06/06/2017  8:17 AM EDT ----- Val- please let her know the culture did not show fungus but damage. Can try urea cream. Thanks.

## 2017-06-10 ENCOUNTER — Encounter: Payer: Self-pay | Admitting: Family Medicine

## 2017-06-13 ENCOUNTER — Ambulatory Visit: Payer: Medicare Other | Admitting: Podiatry

## 2017-06-14 ENCOUNTER — Ambulatory Visit: Payer: Medicare Other | Admitting: Nutrition

## 2017-06-24 DIAGNOSIS — H5213 Myopia, bilateral: Secondary | ICD-10-CM | POA: Diagnosis not present

## 2017-06-29 ENCOUNTER — Other Ambulatory Visit: Payer: Self-pay | Admitting: Family Medicine

## 2017-06-29 DIAGNOSIS — E559 Vitamin D deficiency, unspecified: Secondary | ICD-10-CM

## 2017-06-29 DIAGNOSIS — Z720 Tobacco use: Secondary | ICD-10-CM

## 2017-07-01 ENCOUNTER — Encounter: Payer: Self-pay | Admitting: Family Medicine

## 2017-07-01 ENCOUNTER — Ambulatory Visit (INDEPENDENT_AMBULATORY_CARE_PROVIDER_SITE_OTHER): Payer: Medicare Other | Admitting: Family Medicine

## 2017-07-01 ENCOUNTER — Telehealth: Payer: Self-pay | Admitting: Family Medicine

## 2017-07-01 ENCOUNTER — Other Ambulatory Visit: Payer: Self-pay

## 2017-07-01 VITALS — BP 138/82 | HR 75 | Temp 98.4°F | Resp 18 | Ht 65.0 in | Wt 189.1 lb

## 2017-07-01 DIAGNOSIS — Z720 Tobacco use: Secondary | ICD-10-CM | POA: Diagnosis not present

## 2017-07-01 DIAGNOSIS — E785 Hyperlipidemia, unspecified: Secondary | ICD-10-CM | POA: Diagnosis not present

## 2017-07-01 DIAGNOSIS — Z794 Long term (current) use of insulin: Secondary | ICD-10-CM

## 2017-07-01 DIAGNOSIS — E1140 Type 2 diabetes mellitus with diabetic neuropathy, unspecified: Secondary | ICD-10-CM | POA: Diagnosis not present

## 2017-07-01 DIAGNOSIS — I10 Essential (primary) hypertension: Secondary | ICD-10-CM

## 2017-07-01 MED ORDER — VARENICLINE TARTRATE 1 MG PO TABS: 1.0000 mg | ORAL_TABLET | Freq: Two times a day (BID) | ORAL | 1 refills | Status: DC

## 2017-07-01 MED ORDER — TRIAMTERENE-HCTZ 37.5-25 MG PO TABS: 1.0000 | ORAL_TABLET | Freq: Every day | ORAL | 3 refills | Status: AC

## 2017-07-01 NOTE — Telephone Encounter (Signed)
Patient out of blood pressure medication. hasn't had meds in 3days. I scheduled her a followup 07/11/17, told her she must keep this.

## 2017-07-01 NOTE — Progress Notes (Signed)
Patient ID: Tasha CroissantKlatoya A Aguilar, female    DOB: 12-31-1965, 52 y.o.   MRN: [id number]  Chief Complaint  Patient presents with  . Hypertension    Allergies Latex  Subjective:   Tasha Aguilar is a 52 y.o. female who presents to [facility] today.  HPI Here for follow up. Has been taking BP medication and it has come down. Has been on both blood sugar medications. No hypoglycemia. No side effects with medications. No CP, SOB, palpitations.  No diarrhea or, nausea, vomiting.  No side effects with metformin.  Reports that her high sugars have been in the 200s but correlates this to when she eats pizza.  She reports she understands that the pizza really increases her blood sugars but she still occasionally has it.  Overall she has seen a decline in her blood sugars over the past couple months.  She is not having any myalgias or side effects with the cholesterol-lowering medications.  She is taking her Singulair for allergy/asthma.  Denies any cough or shortness of breath.  Is cut down her smoking but needs a refill on the Chantix.  Has not quit smoking yet.  Is still motivated  Has completed the  starting pack but needs a refill on the continuation pack.  Did not have any side effects with the fluid pill.  Brings in her blood pressure cuff today.  Her blood pressure cuff reading was consistent with her reading in the office.  She reports at home her blood pressures are usually running in the 140s over high 80s.  She reports that she cannot have a note stating that it would be beneficial for her to exercise that her insurance would cover her to have some transportation to the gym.  She has not sure who this letter should be made out to.   Past Medical History:  Diagnosis Date  . AMI (acute myocardial infarction) (HCC) 2006   "mild"  . Diabetes mellitus without complication (HCC)   . Hyperlipidemia   . Hypertension     Past Surgical History:  Procedure Laterality Date  . ABDOMINAL  HYSTERECTOMY     due to menorrhagia  . APPENDECTOMY    . CESAREAN SECTION    . CHOLECYSTECTOMY    . JOINT REPLACEMENT     total left knee  . LUNG BIOPSY      Family History  Problem Relation Age of Onset  . Diabetes Mother   . Glaucoma Mother   . Hypertension Mother   . Hypertension Father   . Diabetes Father   . Heart disease Father   . Glaucoma Father   . Throat cancer Father   . Kidney disease Brother   . Diabetes Brother   . Hypertension Brother   . Hypertension Brother   . Diabetes Brother   . Colon cancer Neg Hx   . Gastric cancer Neg Hx   . Esophageal cancer Neg Hx      Social History   Socioeconomic History  . Marital status: Single    Spouse name: Not on file  . Number of children: 4  . Years of education: 4512  . Highest education level: 12th grade  Occupational History  . Not on file  Social Needs  . Financial resource strain: Not on file  . Food insecurity:    Worry: Not on file    Inability: Not on file  . Transportation needs:    Medical: Not on file    Non-medical:  Not on file  Tobacco Use  . Smoking status: Current Every Day Smoker    Packs/day: 1.00    Years: 34.00    Pack years: 34.00    Types: Cigarettes  . Smokeless tobacco: Never Used  Substance and Sexual Activity  . Alcohol use: No    Frequency: Never  . Drug use: No  . Sexual activity: Yes    Birth control/protection: Surgical  Lifestyle  . Physical activity:    Days per week: Not on file    Minutes per session: Not on file  . Stress: Not on file  Relationships  . Social connections:    Talks on phone: Not on file    Gets together: Not on file    Attends religious service: Not on file    Active member of club or organization: Not on file    Attends meetings of clubs or organizations: Not on file    Relationship status: Not on file  Other Topics Concern  . Not on file  Social History Narrative   Lives in [place], single but lives with man. Has children, 4. Grew up in [place]  and moved to [place] in 2017. Moved to [place] in 2018.    Enjoys movies, tv.    Eats all food groups.   Wear seat belt.   Drives.    Does not go to church.    Current Outpatient Medications on File Prior to Visit  Medication Sig Dispense Refill  . albuterol (PROAIR HFA) 108 (90 Base) MCG/ACT inhaler Inhale into the lungs every 6 (six) hours as needed for wheezing or shortness of breath.    Marland Kitchen amLODipine (NORVASC) 10 MG tablet Take 1 tablet (10 mg total) by mouth daily. 90 tablet 1  . atorvastatin (LIPITOR) 40 MG tablet Take 1 tablet (40 mg total) by mouth daily. 90 tablet 1  . diclofenac sodium (VOLTAREN) 1 % GEL Apply topically 4 (four) times daily.    Marland Kitchen dicyclomine (BENTYL) 10 MG capsule Take 1 capsule (10 mg total) by mouth 3 (three) times daily before meals. 90 capsule 3  . fenofibrate (TRICOR) 145 MG tablet Take 1 tablet (145 mg total) by mouth daily. 90 tablet 0  . glipiZIDE (GLIPIZIDE XL) 10 MG 24 hr tablet Take 1 tablet (10 mg total) by mouth daily with breakfast. 90 tablet 1  . lisinopril (PRINIVIL,ZESTRIL) 40 MG tablet Take 1 tablet (40 mg total) by mouth daily. 90 tablet 3  . metFORMIN (GLUCOPHAGE) 1000 MG tablet Take 1,000 mg by mouth 2 (two) times daily.    . metoprolol succinate (TOPROL-XL) 50 MG 24 hr tablet Take 1 tablet (50 mg total) by mouth daily. Take with or immediately following a meal. 90 tablet 3  . montelukast (SINGULAIR) 10 MG tablet Take 10 mg by mouth at bedtime.    Marland Kitchen omeprazole (PRILOSEC) 40 MG capsule Take 1 capsule (40 mg total) by mouth daily. 30 capsule 3  . Vitamin D, Ergocalciferol, (DRISDOL) 50000 units CAPS capsule TAKE 1 CAPSULE (50,000 UNITS TOTAL) BY MOUTH EVERY 7 (SEVEN) DAYS. 12 capsule 1   No current facility-administered medications on file prior to visit.     Review of Systems  Constitutional: Negative for activity change, appetite change and fever.  HENT: Negative for trouble swallowing.   Eyes: Negative for visual disturbance.  Respiratory:  Negative for cough, chest tightness and shortness of breath.   Cardiovascular: Negative for chest pain, palpitations and leg swelling.  Gastrointestinal: Negative for abdominal pain, nausea and  vomiting.  Genitourinary: Negative for dysuria, frequency and urgency.  Skin: Negative for pallor, rash and wound.  Neurological: Negative for dizziness, syncope and light-headedness.  Hematological: Negative for adenopathy. Does not bruise/bleed easily.     Objective:   BP 124/80 (BP Location: Left Arm, Patient Position: Sitting, Cuff Size: Large)   Pulse 75   Temp 98.4 F (36.9 C) (Temporal)   Resp 18   Ht 5\' 5"  (1.651 m)   Wt 189 lb 1.9 oz (85.8 kg)   SpO2 97%   BMI 31.47 kg/m   Physical Exam  Constitutional: She is oriented to person, place, and time. She appears well-developed and well-nourished. No distress.  HENT:  Head: Normocephalic and atraumatic.  Eyes: Pupils are equal, round, and reactive to light. Conjunctivae are normal. No scleral icterus.  Neck: Normal range of motion. Neck supple. No thyromegaly present.  Cardiovascular: Normal rate, regular rhythm, normal heart sounds and intact distal pulses.  Pulmonary/Chest: Effort normal and breath sounds normal. No respiratory distress.  Neurological: She is alert and oriented to person, place, and time. No cranial nerve deficit.  Skin: Skin is warm and dry.  Psychiatric: She has a normal mood and affect. Her behavior is normal. Judgment and thought content normal.  Nursing note and vitals reviewed.    Assessment and Plan  1. Essential hypertension D/C the HCTZ. Start triamterene/HCTZ. Follow up in 1 months. -Lifestyle modifications discussed with patient including a diet emphasizing vegetables, fruits, and whole grains. Limiting intake of sodium to less than 2,400 mg per day.  Recommendations discussed include consuming low-fat dairy products, poultry, fish, legumes, non-tropical vegetable oils, and nuts; and limiting intake  of sweets, sugar-sweetened beverages, and red meat. Discussed following a plan such as the Dietary Approaches to Stop Hypertension (DASH) diet. Patient to read up on this diet.   - COMPLETE METABOLIC PANEL WITH GFR - triamterene-hydrochlorothiazide (MAXZIDE-25) 37.5-25 MG tablet; Take 1 tablet by mouth daily.  Dispense: 90 tablet; Refill: 3  2. Hyperlipidemia LDL goal <70 Check to see if LDL at goal.  Has been consistently taking statin medication nightly.  Denies any myalgias. - Lipid panel  3. Type 2 diabetes mellitus with diabetic neuropathy, with long-term current use of insulin (HCC) A1c.  Diet, exercise, and weight loss modifications recommended.  We discussed how this would aid in her weight management, blood pressure, and diabetes. - Hemoglobin A1c - COMPLETE METABOLIC PANEL WITH GFR  4. Tobacco abuse The 5 A's Model for treating Tobacco Use and Dependence was used today. I have identified and documented tobacco use status for this patient. I have urged the patient to quit tobacco use. At this time, the patient is willing and ready to attempt to quit. We have discussed medication options to aid in smoking cessation including nicotine replacement therapy (NRT), zyban, and chantix. We have also discussed behavioral modifications, lifestyle changes, and patient support options to aid in tobacco cessation success. Patient was congratulated on their desire to make this positive change for their health. Patient instructed to keep their follow up to ensure success.   We discussed chantix for smoking cessation. Discussed that serious neuropsychiatric adverse events have been reported in patients treated with this medication, including changes in mood, psychosis, hallucinations, paranoia, delusion, homicidal ideation, aggression, hostility, agitation, anxiety, suicide ideation, suicide attempt, and completed suicide. Advised patient that if taking this medication and any symptoms occur that the  patient should STOP taking chantix and contact a healthcare provider via the ED or our office.  Discussed that other common adverse reactions can occur with this medication such as nausea, abnormal dreams, constipation, gas, and vomiting. Patient told that upon starting medication that they should use caution when driving or operating machinery or engaging in hazardous activity until they know how this medication will affect them. Patient understands the risk of this medication and voiced understanding. Agrees to keep follow up appointments and follow the treatment plan.   - varenicline (CHANTIX CONTINUING MONTH PAK) 1 MG tablet; Take 1 tablet (1 mg total) by mouth 2 (two) times daily.  Dispense: 60 tablet; Refill: 1  Discussed with patient that I would be happy to write a letter stating that I believe her exercising at the gym/curves would be beneficial to her health.  I discussed with patient that exercise would help with blood sugar and blood pressure control.  She will call the office with who we should address this letter to.  Continue current visits with dietitian. Return in about 1 month (around 07/29/2017) for BP. Aliene Beams, MD 07/01/2017

## 2017-07-01 NOTE — Telephone Encounter (Signed)
Patient seen in office today. 

## 2017-07-02 LAB — LIPID PANEL
Cholesterol: 203 mg/dL — ABNORMAL HIGH (ref <200)
HDL: 42 mg/dL — ABNORMAL LOW (ref >50)
LDL Cholesterol (Calc): 141 mg/dL (calc) — ABNORMAL HIGH
Non-HDL Cholesterol (Calc): 161 mg/dL (calc) — ABNORMAL HIGH (ref <130)
Total CHOL/HDL Ratio: 4.8 (calc) (ref <5.0)
Triglycerides: 92 mg/dL (ref <150)

## 2017-07-02 LAB — COMPLETE METABOLIC PANEL WITH GFR
AG Ratio: 1.4 (calc) (ref 1.0–2.5)
ALT: 17 U/L (ref 6–29)
AST: 18 U/L (ref 10–35)
Albumin: 4.5 g/dL (ref 3.6–5.1)
Alkaline phosphatase (APISO): 103 U/L (ref 33–130)
BUN/Creatinine Ratio: 22 (calc) (ref 6–22)
BUN: 24 mg/dL (ref 7–25)
CO2: 28 mmol/L (ref 20–32)
Calcium: 10.4 mg/dL (ref 8.6–10.4)
Chloride: 104 mmol/L (ref 98–110)
Creat: 1.09 mg/dL — ABNORMAL HIGH (ref 0.50–1.05)
GFR, Est African American: 68 mL/min/{1.73_m2} (ref >=60)
GFR, Est Non African American: 58 mL/min/{1.73_m2} — ABNORMAL LOW (ref >=60)
Globulin: 3.3 g/dL (calc) (ref 1.9–3.7)
Glucose, Bld: 126 mg/dL (ref 65–139)
Potassium: 4.4 mmol/L (ref 3.5–5.3)
Sodium: 141 mmol/L (ref 135–146)
Total Bilirubin: 0.4 mg/dL (ref 0.2–1.2)
Total Protein: 7.8 g/dL (ref 6.1–8.1)

## 2017-07-02 LAB — HEMOGLOBIN A1C
Hgb A1c MFr Bld: 7.2 % of total Hgb — ABNORMAL HIGH (ref <5.7)
Mean Plasma Glucose: 160 (calc)
eAG (mmol/L): 8.9 (calc)

## 2017-07-06 ENCOUNTER — Encounter: Payer: Self-pay | Admitting: Family Medicine

## 2017-07-06 DIAGNOSIS — E1169 Type 2 diabetes mellitus with other specified complication: Secondary | ICD-10-CM

## 2017-07-06 DIAGNOSIS — E785 Hyperlipidemia, unspecified: Principal | ICD-10-CM

## 2017-07-11 ENCOUNTER — Ambulatory Visit (INDEPENDENT_AMBULATORY_CARE_PROVIDER_SITE_OTHER): Payer: Medicare Other | Admitting: Podiatry

## 2017-07-11 ENCOUNTER — Encounter: Payer: Self-pay | Admitting: Podiatry

## 2017-07-11 DIAGNOSIS — E1149 Type 2 diabetes mellitus with other diabetic neurological complication: Secondary | ICD-10-CM | POA: Diagnosis not present

## 2017-07-11 DIAGNOSIS — R61 Generalized hyperhidrosis: Secondary | ICD-10-CM | POA: Diagnosis not present

## 2017-07-11 DIAGNOSIS — L600 Ingrowing nail: Secondary | ICD-10-CM | POA: Diagnosis not present

## 2017-07-11 DIAGNOSIS — B351 Tinea unguium: Secondary | ICD-10-CM

## 2017-07-11 MED ORDER — ALUMINUM CHLORIDE 20 % EX SOLN: Freq: Every day | CUTANEOUS | 0 refills | Status: DC

## 2017-07-11 MED ORDER — NUVAIL EX SOLN: 1.0000 "application " | Freq: Every day | CUTANEOUS | 1 refills | Status: DC

## 2017-07-11 NOTE — Patient Instructions (Addendum)

## 2017-07-11 NOTE — Progress Notes (Signed)
Subjective: 52 year old female presents the office today for follow-up evaluation of ingrown toenails and to discuss nail culture results.  She has not yet gotten the cream that we called her with the nails.  She states that the nails are not painful since the treatment last appointment.  She has not had any redness or drainage.  Her last A1c was significantly improved on the 7.2.  She has previously been diagnosed with neuropathy.  She denies any claudication symptoms.  She is asking for refill of some medicine to help dry her feet that she had several years ago.  She does notice her feet sweat.  She has no new concerns otherwise.  enies any systemic complaints such as fevers, chills, nausea, vomiting. No acute changes since last appointment, and no other complaints at this time.   Objective: AAO x3, NAD DP/PT pulses palpable bilaterally, CRT less than 3 seconds She is previously diagnosed with neuropathy. The nails are hypertrophic and dystrophic with yellow to brown discoloration and there is incurvation present on both hallux toenails more than the lesser digit nails.  There is no pain in the nails there is no swelling redness or drainage.  There is no signs of infection present.  There is no evidence of tinea pedis and there is no skin breakdown identified today. No open lesions or pre-ulcerative lesions.  No pain with calf compression, swelling, warmth, erythema  Assessment: 52 year old female onychodystrophy, hyperhidrosis, ingrown toenails  Plan: -All treatment options discussed with the patient including all alternatives, risks, complications.  -I believe the culture with her.  Can be a false negative and after discussion regards to treatment options we are going to try Nuvail and this was sent to her pharmacy today.  I also ordered Drysol to her feet to help with the excess sweating.  Monitoring skin breakdown or fungus. -We discussed partial nail avulsions however the nails are a symptom  medic at this time.  As a courtesy I did debride her nails for her.  She wants to go on a regular schedule for nail trim as given the gets thick and pain -Patient encouraged to call the office with any questions, concerns, change in symptoms.   Return in about 2 months (around 09/10/2017).  Vivi BarrackMatthew R Linford Quintela DPM

## 2017-07-12 MED ORDER — ATORVASTATIN CALCIUM 80 MG PO TABS: 80.0000 mg | ORAL_TABLET | Freq: Every day | ORAL | 0 refills | Status: DC

## 2017-07-18 ENCOUNTER — Other Ambulatory Visit: Payer: Self-pay

## 2017-07-18 DIAGNOSIS — H5203 Hypermetropia, bilateral: Secondary | ICD-10-CM | POA: Diagnosis not present

## 2017-07-18 NOTE — Patient Outreach (Signed)
[facility] Management  07/18/2017  Serena CroissantKlatoya A Aguilar 1965/02/07 [id number]   Medication Adherence call to [name] left a message for patient to call back patient is due on Lisinopril 40 mg and Atorvastatin 40 mg. patient telephone number is disconnected under [facility].[name] is showing past due under [facility] Ins.  Lillia AbedAna Ollison-Moran CPhT Pharmacy Technician [facility] Direct Dial [phone]  Fax [phone] [email or web address]

## 2017-07-27 ENCOUNTER — Ambulatory Visit (INDEPENDENT_AMBULATORY_CARE_PROVIDER_SITE_OTHER): Payer: Medicare Other | Admitting: Nurse Practitioner

## 2017-07-27 ENCOUNTER — Encounter: Payer: Self-pay | Admitting: Nurse Practitioner

## 2017-07-27 VITALS — BP 130/86 | HR 76 | Temp 97.0°F | Ht 66.0 in | Wt 188.8 lb

## 2017-07-27 DIAGNOSIS — K580 Irritable bowel syndrome with diarrhea: Secondary | ICD-10-CM | POA: Diagnosis not present

## 2017-07-27 DIAGNOSIS — K589 Irritable bowel syndrome without diarrhea: Secondary | ICD-10-CM

## 2017-07-27 DIAGNOSIS — R1030 Lower abdominal pain, unspecified: Secondary | ICD-10-CM

## 2017-07-27 DIAGNOSIS — K219 Gastro-esophageal reflux disease without esophagitis: Secondary | ICD-10-CM | POA: Diagnosis not present

## 2017-07-27 DIAGNOSIS — R109 Unspecified abdominal pain: Secondary | ICD-10-CM | POA: Insufficient documentation

## 2017-07-27 HISTORY — DX: Irritable bowel syndrome, unspecified: K58.9

## 2017-07-27 HISTORY — DX: Unspecified abdominal pain: R10.9

## 2017-07-27 MED ORDER — DIPHENOXYLATE-ATROPINE 2.5-0.025 MG PO TABS: 1.0000 | ORAL_TABLET | Freq: Four times a day (QID) | ORAL | 2 refills | Status: DC | PRN

## 2017-07-27 NOTE — Progress Notes (Signed)
Referring Provider: Aliene Beams, MD Primary Care Physician:  Aliene Beams, MD Primary GI:  Dr. Jena Gauss  Chief Complaint  Patient presents with  . Gastroesophageal Reflux    f/u. Doing okay  . Irritable Bowel Syndrome    f/u. Loose BM's twice a day    HPI:   Tasha Aguilar is a 52 y.o. female who presents for follow-up on GERD and IBS.  Patient was last seen in our office 05/02/2017 for the same.  Patient noted chronic history of IBS.  At primary care the patient was noted to be overdue for colonoscopy but she requested Cologuard instead, which primary care performed.  This resulted negative.  At her last visit she is doing okay overall.  Chronic IBS D and currently on medication.  Previously on Viberzi which worked well for her but she does not have a gallbladder.  She notes sharp, stabbing abdominal pain associated with diarrhea as well as gas and bloating and her symptoms improve after a bowel movement.  Has diarrhea after every meal, consumes minimal dairy.  No hematochezia.  She is okay with colonoscopy, last colonoscopy in 2014 and told everything was normal.  We requested reports.  Chronic GERD symptoms as well not on any medication, previously on Protonix/ranitidine which did not work well.  Did take a different PPI (she thought omeprazole?)  Which worked better.  No other GI symptoms.  Recommended omeprazole 40 mg daily, Bentyl 10 mg 3 times a day as needed, request previous colonoscopy report from [place], progress report in 2 to 4 weeks, follow-up in 2 months.  No progress report was called our office.  This endoscopic results received.  She had an EGD and colonoscopy on 03/30/2013.  Anoscopy was normal and limited ileoscopy with no evidence of colitis status post random biopsies due to diarrhea.  EGD noted gastritis and small hiatal hernia.  Recommended treatment for acid reflux, Imodium as needed.  Colon biopsies found no significant pathological change.  Stomach biopsies found  antral and oxyntic antral gastric mucosa with mild chronic inactive gastritis without H. pylori.  Today she states she's doing ok overall. GERD improved on PPI. Bentyl "caused worse diarrhea" and also had some episodes of incontinence. She stopped taking bentyl. Having at least 1 bowel movement a day on Imodium (which is helping). If not on Imodium she would have 3-4 bowel movements a day. Still with lower abdominal pain, improves with a bowel movement. No gallbladder. Denies hematochezia, melena, fever, chills, unintentional weight loss. Drinks plenty of fluids. Denies chest pain, dyspnea, dizziness, lightheadedness, syncope, near syncope. Denies any other upper or lower GI symptoms.  She has been on Metformin "at least 5-6 years" but has had the diarrhea symptoms "forever."   Past Medical History:  Diagnosis Date  . AMI (acute myocardial infarction) (HCC) 2006   "mild"  . Diabetes mellitus without complication (HCC)   . Hyperlipidemia   . Hypertension     Past Surgical History:  Procedure Laterality Date  . ABDOMINAL HYSTERECTOMY     due to menorrhagia  . APPENDECTOMY    . CESAREAN SECTION    . CHOLECYSTECTOMY    . JOINT REPLACEMENT     total left knee  . LUNG BIOPSY      Current Outpatient Medications  Medication Sig Dispense Refill  . albuterol (PROAIR HFA) 108 (90 Base) MCG/ACT inhaler Inhale into the lungs every 6 (six) hours as needed for wheezing or shortness of breath.    Marland Kitchen  aluminum chloride (DRYSOL) 20 % external solution Apply topically at bedtime. 35 mL 0  . amLODipine (NORVASC) 10 MG tablet Take 1 tablet (10 mg total) by mouth daily. 90 tablet 1  . atorvastatin (LIPITOR) 80 MG tablet Take 1 tablet (80 mg total) by mouth daily. 90 tablet 0  . Dermatological Products, Misc. (NUVAIL) SOLN Apply 1 application topically daily. 1 Bottle 1  . diclofenac sodium (VOLTAREN) 1 % GEL Apply topically 4 (four) times daily.    . fenofibrate (TRICOR) 145 MG tablet Take 1 tablet (145  mg total) by mouth daily. 90 tablet 0  . glipiZIDE (GLIPIZIDE XL) 10 MG 24 hr tablet Take 1 tablet (10 mg total) by mouth daily with breakfast. 90 tablet 1  . lisinopril (PRINIVIL,ZESTRIL) 40 MG tablet Take 1 tablet (40 mg total) by mouth daily. 90 tablet 3  . loperamide (IMODIUM A-D) 2 MG tablet Take 2 mg by mouth as needed for diarrhea or loose stools.    . metFORMIN (GLUCOPHAGE) 1000 MG tablet Take 1,000 mg by mouth 2 (two) times daily.    . metoprolol succinate (TOPROL-XL) 50 MG 24 hr tablet Take 1 tablet (50 mg total) by mouth daily. Take with or immediately following a meal. 90 tablet 3  . montelukast (SINGULAIR) 10 MG tablet Take 10 mg by mouth at bedtime.    Marland Kitchen. omeprazole (PRILOSEC) 40 MG capsule Take 1 capsule (40 mg total) by mouth daily. 30 capsule 3  . triamterene-hydrochlorothiazide (MAXZIDE-25) 37.5-25 MG tablet Take 1 tablet by mouth daily. 90 tablet 3  . varenicline (CHANTIX CONTINUING MONTH PAK) 1 MG tablet Take 1 tablet (1 mg total) by mouth 2 (two) times daily. 60 tablet 1  . Vitamin D, Ergocalciferol, (DRISDOL) 50000 units CAPS capsule TAKE 1 CAPSULE (50,000 UNITS TOTAL) BY MOUTH EVERY 7 (SEVEN) DAYS. 12 capsule 1  . diphenoxylate-atropine (LOMOTIL) 2.5-0.025 MG tablet Take 1 tablet by mouth 4 (four) times daily as needed for diarrhea or loose stools. 30 tablet 2   No current facility-administered medications for this visit.     Allergies as of 07/27/2017 - Review Complete 07/27/2017  Allergen Reaction Noted  . Latex Other (See Comments) 02/18/2017    Family History  Problem Relation Age of Onset  . Diabetes Mother   . Glaucoma Mother   . Hypertension Mother   . Hypertension Father   . Diabetes Father   . Heart disease Father   . Glaucoma Father   . Throat cancer Father   . Kidney disease Brother   . Diabetes Brother   . Hypertension Brother   . Hypertension Brother   . Diabetes Brother   . Colon cancer Neg Hx   . Gastric cancer Neg Hx   . Esophageal cancer  Neg Hx     Social History   Socioeconomic History  . Marital status: Single    Spouse name: Not on file  . Number of children: 4  . Years of education: 5812  . Highest education level: 12th grade  Occupational History  . Not on file  Social Needs  . Financial resource strain: Not on file  . Food insecurity:    Worry: Not on file    Inability: Not on file  . Transportation needs:    Medical: Not on file    Non-medical: Not on file  Tobacco Use  . Smoking status: Current Every Day Smoker    Packs/day: 1.00    Years: 34.00    Pack years: 34.00  Types: Cigarettes  . Smokeless tobacco: Never Used  Substance and Sexual Activity  . Alcohol use: No    Frequency: Never  . Drug use: No  . Sexual activity: Yes    Birth control/protection: Surgical  Lifestyle  . Physical activity:    Days per week: Not on file    Minutes per session: Not on file  . Stress: Not on file  Relationships  . Social connections:    Talks on phone: Not on file    Gets together: Not on file    Attends religious service: Not on file    Active member of club or organization: Not on file    Attends meetings of clubs or organizations: Not on file    Relationship status: Not on file  Other Topics Concern  . Not on file  Social History Narrative   Lives in [place], single but lives with man. Has children, 4. Grew up in [place] and moved to [place] in 2017. Moved to [place] in 2018.    Enjoys movies, tv.    Eats all food groups.   Wear seat belt.   Drives.    Does not go to church.     Review of Systems: General: Negative for anorexia, weight loss, fever, chills, fatigue, weakness. ENT: Negative for hoarseness, difficulty swallowing. CV: Negative for chest pain, angina, palpitations, peripheral edema.  Respiratory: Negative for dyspnea at rest, cough, sputum, wheezing.  GI: See history of present illness. Endo: Negative for unusual weight change.  Heme: Negative for bruising or bleeding. Allergy: Negative  for rash or hives.   Physical Exam: BP 130/86   Pulse 76   Temp (!) 97 F (36.1 C) (Oral)   Ht 5\' 6"  (1.676 m)   Wt 188 lb 12.8 oz (85.6 kg)   BMI 30.47 kg/m  General:   Alert and oriented. Pleasant and cooperative. Well-nourished and well-developed.  Eyes:  Without icterus, sclera clear and conjunctiva pink.  Ears:  Normal auditory acuity. Cardiovascular:  S1, S2 present without murmurs appreciated. Extremities without clubbing or edema. Respiratory:  Clear to auscultation bilaterally. No wheezes, rales, or rhonchi. No distress.  Gastrointestinal:  +BS, soft, non-tender and non-distended. No HSM noted. No guarding or rebound. No masses appreciated.  Rectal:  Deferred  Musculoskalatal:  Symmetrical without gross deformities. Skin:  Intact without significant lesions or rashes. Neurologic:  Alert and oriented x4;  grossly normal neurologically. Psych:  Alert and cooperative. Normal mood and affect. Heme/Lymph/Immune: No excessive bruising noted.    07/27/2017 9:21 AM   Disclaimer: This note was dictated with voice recognition software. Similar sounding words can inadvertently be transcribed and may not be corrected upon review.

## 2017-07-27 NOTE — Assessment & Plan Note (Signed)
Vision has long-standing history of irritable bowel syndrome diarrhea type.  Bentyl did not help.  She does not have a gallbladder in situ and is not a candidate for Viberzi.  Imodium helps with her number of stools but does not help with the abdominal pain significantly.  We will try her on Lomotil.  If she is not responded to Lomotil we can try Xifaxan in the future.  I will send in a prescription to her pharmacy, request progress report in 2 weeks.  Follow-up in 3 months.  Overall, she feels well.

## 2017-07-27 NOTE — Progress Notes (Signed)
CC'D TO PCP °

## 2017-07-27 NOTE — Patient Instructions (Signed)
1. I have sent a prescription for Lomotil to your pharmacy.  You can take this up to 4 times a day as needed for diarrhea. 2. Continue taking taking omeprazole. 3. Call me in 2 weeks and let us know if your bowel movements are improved on Lomotil. 4. Return for follow-up in 3 months. 5. Call us if you have any questions or concerns.  At [facility] we value your feedback. You may receive a survey about your visit today. Please share your experience as we strive to create trusting relationships with our patients to provide genuine, compassionate, quality care.  It was great to see you today!  I hope you have a great summer!!

## 2017-07-27 NOTE — Assessment & Plan Note (Signed)
The patient does have lower abdominal pain.  Bentyl did not help her diarrhea and so she stopped taking it.  She does take Imodium but this is not helped her pain.  Her pain typically resolves after a bowel movement which is generally diarrhea.  Consistent with irritable bowel syndrome diarrhea type.  We will try Lomotil and in the future can try Xifaxan if she is not a responder to Lomotil.  Request progress report in 2 weeks.  Follow-up in 3 months.

## 2017-07-27 NOTE — Assessment & Plan Note (Signed)
GERD symptoms significantly improved on omeprazole 40 mg daily.  No breakthrough symptoms to speak of.  I recommend she continue taking omeprazole.  Follow-up in 3 months.

## 2017-07-28 ENCOUNTER — Encounter: Payer: Self-pay | Admitting: Internal Medicine

## 2017-08-05 ENCOUNTER — Other Ambulatory Visit: Payer: Self-pay

## 2017-08-05 DIAGNOSIS — E1169 Type 2 diabetes mellitus with other specified complication: Secondary | ICD-10-CM

## 2017-08-05 DIAGNOSIS — E785 Hyperlipidemia, unspecified: Principal | ICD-10-CM

## 2017-08-05 MED ORDER — ATORVASTATIN CALCIUM 80 MG PO TABS
80.0000 mg | ORAL_TABLET | Freq: Every day | ORAL | 0 refills | Status: AC
Start: 2017-08-05 — End: ?

## 2017-08-05 MED ORDER — METFORMIN HCL 1000 MG PO TABS: 1000.0000 mg | ORAL_TABLET | Freq: Two times a day (BID) | ORAL | 3 refills | Status: DC

## 2017-08-10 ENCOUNTER — Encounter: Payer: Self-pay | Admitting: Podiatry

## 2017-08-10 MED ORDER — NUVAIL EX SOLN: 1.0000 "application " | Freq: Every day | CUTANEOUS | 1 refills | Status: DC

## 2017-08-10 NOTE — Telephone Encounter (Signed)
Faxed [place] order to [phone].

## 2017-08-10 NOTE — Addendum Note (Signed)
Addended by: Alphia Kava'CONNELL, Josephanthony Tindel D on: 08/10/2017 11:41 AM   Modules accepted: Orders

## 2017-08-12 ENCOUNTER — Encounter: Payer: Self-pay | Admitting: Family Medicine

## 2017-08-12 ENCOUNTER — Ambulatory Visit (INDEPENDENT_AMBULATORY_CARE_PROVIDER_SITE_OTHER): Payer: Medicare Other | Admitting: Family Medicine

## 2017-08-12 ENCOUNTER — Other Ambulatory Visit: Payer: Self-pay

## 2017-08-12 VITALS — BP 116/78 | HR 75 | Temp 98.3°F | Resp 12 | Ht 66.0 in | Wt 187.1 lb

## 2017-08-12 DIAGNOSIS — E669 Obesity, unspecified: Secondary | ICD-10-CM | POA: Diagnosis not present

## 2017-08-12 DIAGNOSIS — I10 Essential (primary) hypertension: Secondary | ICD-10-CM

## 2017-08-12 DIAGNOSIS — E118 Type 2 diabetes mellitus with unspecified complications: Secondary | ICD-10-CM | POA: Diagnosis not present

## 2017-08-12 DIAGNOSIS — Z720 Tobacco use: Secondary | ICD-10-CM | POA: Diagnosis not present

## 2017-08-12 MED ORDER — MONTELUKAST SODIUM 10 MG PO TABS: 10.0000 mg | ORAL_TABLET | Freq: Every day | ORAL | 1 refills | Status: DC

## 2017-08-12 NOTE — Progress Notes (Signed)
Patient ID: Tasha Aguilar, female    DOB: 1965-02-27, 52 y.o.   MRN: [id number]  Chief Complaint  Patient presents with  . Hypertension    follow up  . Hyperlipidemia    Allergies Latex  Subjective:   Tasha Aguilar is a 52 y.o. female who presents to [facility] today.  HPI Here for follow up.  Patient reports that she has been taking the triamterene/HCTZ daily in addition to her other blood pressure medications.  She reports that she is feeling better and her blood pressure is running great.  She reports that she thought her machine initially was not working because her blood pressure has not been controlled in the long time.  She said her readings at home have been running in the 116-120/70-80.  She has been trying to cut down on smoking and has gone from 1 pack/day to now she is smoking 1/2 ppd.  She reports that the reason her cholesterol values went up is because the pharmacy refilled her 40 mg of Lipitor instead of the 80 that we had switched her to.  Therefore the for the past several months she is only been on Lipitor 40 mg a day.  She is now back on the 80 mg a day.  She reports she is very happy with her blood sugar readings.  Has lost 2 pounds.  Is taking the Glucotrol and the metformin.  Denies any side effects.  Denies any hypoglycemic episodes.  Very happy that her A1c is down from almost 10 to 7%.  Reports she is urinating well.  No dysuria.  No swelling in her extremities.  Energy is good.  Mood is good.  Is still not getting much exercise.  Has tried to cut down on her carbohydrate intake and is eating less pizza.  Denies any chest pain, shortness of breath, or swelling in her extremities.  Denies any lesions on her feet.  Taking the Chantix and believe it is helping.  Does not have any mood side effects with the medication.  Denies any auditory or visual hallucination.  Denies any suicidal or homicidal ideation.  Does not feel down, depressed, or hopeless.   Reports that quitting smoking is 1 of the hardest thing she has never had to do.  Does smoke when she first wakes up in the morning and right before she goes to bed.  Tends to smoke after each meal.   Past Medical History:  Diagnosis Date  . AMI (acute myocardial infarction) (HCC) 2006   "mild"  . Diabetes mellitus without complication (HCC)   . Hyperlipidemia   . Hypertension     Past Surgical History:  Procedure Laterality Date  . ABDOMINAL HYSTERECTOMY     due to menorrhagia  . APPENDECTOMY    . CESAREAN SECTION    . CHOLECYSTECTOMY    . JOINT REPLACEMENT     total left knee  . LUNG BIOPSY      Family History  Problem Relation Age of Onset  . Diabetes Mother   . Glaucoma Mother   . Hypertension Mother   . Hypertension Father   . Diabetes Father   . Heart disease Father   . Glaucoma Father   . Throat cancer Father   . Kidney disease Brother   . Diabetes Brother   . Hypertension Brother   . Hypertension Brother   . Diabetes Brother   . Colon cancer Neg Hx   . Gastric cancer Neg Hx   .  Esophageal cancer Neg Hx      Social History   Socioeconomic History  . Marital status: Single    Spouse name: Not on file  . Number of children: 4  . Years of education: 2712  . Highest education level: 12th grade  Occupational History  . Not on file  Social Needs  . Financial resource strain: Not on file  . Food insecurity:    Worry: Not on file    Inability: Not on file  . Transportation needs:    Medical: Not on file    Non-medical: Not on file  Tobacco Use  . Smoking status: Current Every Day Smoker    Packs/day: 1.00    Years: 34.00    Pack years: 34.00    Types: Cigarettes  . Smokeless tobacco: Never Used  Substance and Sexual Activity  . Alcohol use: No    Frequency: Never  . Drug use: No  . Sexual activity: Yes    Birth control/protection: Surgical  Lifestyle  . Physical activity:    Days per week: Not on file    Minutes per session: Not on file  .  Stress: Not on file  Relationships  . Social connections:    Talks on phone: Not on file    Gets together: Not on file    Attends religious service: Not on file    Active member of club or organization: Not on file    Attends meetings of clubs or organizations: Not on file    Relationship status: Not on file  Other Topics Concern  . Not on file  Social History Narrative   [place] in [place], single but lives with man. Has children, 4. Grew up in [place] and moved to [place] in [place]. Moved to [place] in 2018.    Enjoys movies, tv.    Eats all food groups.   Wear seat belt.   Drives.    Does not go to church.     Review of Systems  Constitutional: Negative for activity change, appetite change and fever.  HENT: Negative for trouble swallowing and voice change.   Eyes: Negative for visual disturbance.  Respiratory: Negative for cough, chest tightness and shortness of breath.   Cardiovascular: Negative for chest pain, palpitations and leg swelling.  Gastrointestinal: Negative for abdominal pain, nausea and vomiting.  Genitourinary: Negative for dysuria, frequency and urgency.  Neurological: Negative for dizziness, syncope and light-headedness.  Hematological: Negative for adenopathy.     Objective:   BP 116/78 (BP Location: Left Arm, Patient Position: Sitting, Cuff Size: Large)   Pulse 75   Temp 98.3 F (36.8 C) (Temporal)   Resp 12   Ht 5\' 6"  (1.676 m)   Wt 187 lb 1.3 oz (84.9 kg)   SpO2 100% Comment: room air  BMI 30.20 kg/m   Physical Exam  Constitutional: She is oriented to person, place, and time. She appears well-developed and well-nourished. No distress.  HENT:  Head: Normocephalic and atraumatic.  Mouth/Throat: Oropharynx is clear and moist.  Eyes: Pupils are equal, round, and reactive to light. Conjunctivae are normal. No scleral icterus.  Neck: Normal range of motion. Neck supple.  Cardiovascular: Normal rate, regular rhythm and normal heart sounds.  Pulmonary/Chest:  Effort normal and breath sounds normal. No respiratory distress.  Abdominal: Soft.  Musculoskeletal: Normal range of motion.  Neurological: She is alert and oriented to person, place, and time. No cranial nerve deficit.  Skin: Skin is warm and dry.  Psychiatric: She has  a normal mood and affect. Her behavior is normal. Judgment and thought content normal.  Nursing note and vitals reviewed.  Depression screen [facility] 2/9 08/12/2017 07/01/2017 05/10/2017 04/27/2017 02/18/2017  Decreased Interest 0 1 0 1 0  Down, Depressed, Hopeless 0 0 0 1 0  PHQ - 2 Score 0 1 0 2 0  Altered sleeping - - - 2 -  Tired, decreased energy - - - 2 -  Change in appetite - - - 2 -  Feeling bad or failure about yourself  - - - 0 -  Trouble concentrating - - - 0 -  Moving slowly or fidgety/restless - - - 0 -  Suicidal thoughts - - - 0 -  PHQ-9 Score - - - 8 -  Difficult doing work/chores - - - Somewhat difficult -    Assessment and Plan  1. Essential hypertension Improved with new medication.  Continue all medications as directed.  Will refill as needed.  Check BMP today since addition of the triamterene. Lifestyle modifications discussed with patient including a diet emphasizing vegetables, fruits, and whole grains. Limiting intake of sodium to less than 2,400 mg per day.  Recommendations discussed include consuming low-fat dairy products, poultry, fish, legumes, non-tropical vegetable oils, and nuts; and limiting intake of sweets, sugar-sweetened beverages, and red meat. Discussed following a plan such as the Dietary Approaches to Stop Hypertension (DASH) diet. Patient to read up on this diet.   - Basic metabolic panel  2. Type 2 diabetes mellitus with complication, without long-term current use of insulin (HCC) Congratulated on her improvements.  Continue metformin and glipizide.  Diet, exercise, and weight loss modifications discussed.  3. Obesity (BMI 30.0-34.9) Long discussion today about her weight.  She is going  to check and see if [facility] can [facility]  I did tell patient again that I would be happy to write her a letter stating that this is necessary for her management of medical problems.  We discussed today that weight loss would improve her blood sugars and decrease her cardiovascular risk. 4. Tobacco abuse Tobacco cessation was discussed today.  She will continue the Chantix. We discussed the continued use of Chantix for smoking cessation. Discussed that serious neuropsychiatric adverse events have been reported in patients treated with this medication, including changes in mood, psychosis, hallucinations, paranoia, delusion, homicidal ideation, aggression, hostility, agitation, anxiety, suicide ideation, suicide attempt, and completed suicide. Advised patient that if taking this medication and any symptoms occur that the patient should STOP taking chantix and contact a healthcare provider via the ED or our office.  Today we again discussed that other common adverse reactions can occur with this medication such as nausea, abnormal dreams, constipation, gas, and vomiting. Patient understands the risk of this medication and voiced understanding. Agrees to keep follow up appointments and follow the treatment plan.   We did discuss the multiple risks of smoking cigarettes including risk of malignancy and vascular complications. Office visit was greater than 25 minutes.  Greater than 50% of office visit spent counseling and coordinating care. Return in about 3 months (around 11/12/2017). Aliene Beams, MD 08/12/2017

## 2017-08-30 ENCOUNTER — Other Ambulatory Visit: Payer: Self-pay | Admitting: Family Medicine

## 2017-08-30 ENCOUNTER — Other Ambulatory Visit: Payer: Self-pay | Admitting: Podiatry

## 2017-08-30 DIAGNOSIS — E785 Hyperlipidemia, unspecified: Principal | ICD-10-CM

## 2017-08-30 DIAGNOSIS — E1169 Type 2 diabetes mellitus with other specified complication: Secondary | ICD-10-CM

## 2017-09-12 ENCOUNTER — Encounter: Payer: Self-pay | Admitting: Podiatry

## 2017-09-12 ENCOUNTER — Ambulatory Visit (INDEPENDENT_AMBULATORY_CARE_PROVIDER_SITE_OTHER): Payer: Medicare Other | Admitting: Podiatry

## 2017-09-12 DIAGNOSIS — L600 Ingrowing nail: Secondary | ICD-10-CM | POA: Diagnosis not present

## 2017-09-12 MED ORDER — CLINDAMYCIN HCL 300 MG PO CAPS: 300.0000 mg | ORAL_CAPSULE | Freq: Three times a day (TID) | ORAL | 0 refills | Status: DC

## 2017-09-12 MED ORDER — HYDROCODONE-ACETAMINOPHEN 5-325 MG PO TABS: 1.0000 | ORAL_TABLET | Freq: Four times a day (QID) | ORAL | 0 refills | Status: DC | PRN

## 2017-09-12 NOTE — Patient Instructions (Signed)

## 2017-09-13 NOTE — Progress Notes (Signed)
Subjective: 52 year old female presents the office today for concerns of ingrown to both of her big toenails which is been chronic.  We delayed this twice because of her blood sugar as well as her going out of town and at this point the toenails are still causing pain and she was to have the corners removed.  She states her blood sugar has been under control and her A1c was checked a month ago and was down to 7.  She denies any claudication symptoms. Denies any systemic complaints such as fevers, chills, nausea, vomiting. No acute changes since last appointment, and no other complaints at this time.   Objective: AAO x3, NAD DP/PT pulses palpable bilaterally, CRT less than 3 seconds There is incurvation present to both the medial lateral aspects of bilateral hallux toenails with tenderness palpation.  There is no surrounding erythema, ascending sialitis but there is localized edema to the nail corners.  Overall the nails are hypertrophic, dystrophic with yellow-brown discoloration. No open lesions or pre-ulcerative lesions.  No pain with calf compression, swelling, warmth, erythema  Assessment: Chronic ingrown toenails bilateral hallux  Plan: -All treatment options discussed with the patient including all alternatives, risks, complications.  -At this time, the patient is requesting partial nail removal with chemical matricectomy to the symptomatic portion of the nail. Risks and complications were discussed with the patient for which they understand and written consent was obtained. Under sterile conditions a total of 3 mL of a mixture of 2% lidocaine plain and 0.5% Marcaine plain was infiltrated in a hallux block fashion. Once anesthetized, the skin was prepped in sterile fashion. A tourniquet was then applied. Next the medial/lateral aspect of hallux nail border was then sharply excised making sure to remove the entire offending nail border. Once the nails were ensured to be removed area was debrided  and the underlying skin was intact. There is no purulence identified in the procedure. Next phenol was then applied under standard conditions and copiously irrigated. Silvadene was applied. A dry sterile dressing was applied. After application of the dressing the tourniquet was removed and there is found to be an immediate capillary refill time to the digit. The patient tolerated the procedure well any complications. Post procedure instructions were discussed the patient for which he verbally understood. Follow-up in one week for nail check or sooner if any problems are to arise. Discussed signs/symptoms of infection and directed to call the office immediately should any occur or go directly to the emergency room. In the meantime, encouraged to call the office with any questions, concerns, changes symptoms. -Clindamycin  -Patient encouraged to call the office with any questions, concerns, change in symptoms.   Vivi Barrack DPM

## 2017-09-15 ENCOUNTER — Encounter: Payer: Self-pay | Admitting: Family Medicine

## 2017-09-15 ENCOUNTER — Other Ambulatory Visit: Payer: Self-pay

## 2017-09-15 ENCOUNTER — Ambulatory Visit (INDEPENDENT_AMBULATORY_CARE_PROVIDER_SITE_OTHER): Payer: Medicare Other | Admitting: Family Medicine

## 2017-09-15 VITALS — BP 118/72 | HR 70 | Temp 98.1°F | Resp 12 | Ht 65.0 in | Wt 188.0 lb

## 2017-09-15 DIAGNOSIS — E785 Hyperlipidemia, unspecified: Secondary | ICD-10-CM

## 2017-09-15 DIAGNOSIS — I10 Essential (primary) hypertension: Secondary | ICD-10-CM

## 2017-09-15 DIAGNOSIS — Z23 Encounter for immunization: Secondary | ICD-10-CM | POA: Diagnosis not present

## 2017-09-15 DIAGNOSIS — Z720 Tobacco use: Secondary | ICD-10-CM

## 2017-09-15 DIAGNOSIS — E1169 Type 2 diabetes mellitus with other specified complication: Secondary | ICD-10-CM | POA: Diagnosis not present

## 2017-09-15 DIAGNOSIS — G629 Polyneuropathy, unspecified: Secondary | ICD-10-CM

## 2017-09-15 DIAGNOSIS — Z79899 Other long term (current) drug therapy: Secondary | ICD-10-CM | POA: Diagnosis not present

## 2017-09-15 DIAGNOSIS — E118 Type 2 diabetes mellitus with unspecified complications: Secondary | ICD-10-CM

## 2017-09-15 NOTE — Progress Notes (Signed)
Patient ID: Tasha Aguilar, female    DOB: 1965-12-21, 52 y.o.   MRN: [id number]  Chief Complaint  Patient presents with  . Immunizations  . blood work  . referral needed    needed referral for neurologist    Allergies Latex  Subjective:   Tasha Aguilar is a 52 y.o. female who presents to [facility] today.  HPI Here for follow up. Is requesting a referral to neurology for nerve pain. Had a referral in the past but did not get to go before she moved. Nerve pain is in hands, knees, and feet/ankles. Has been there for 3 years. Reports that the pain in the feet never stops. Reports that has decreased sensation in different parts of her body. Has not been tried on any medication or had any nerve studies. Has not had any bloodwork regarding this pain. Reports that despite the pain being there for 3 years it has gotten worse and now there is a greater area of nerve pain and numbness. Starts with pain and then gets the numb feeling. Pain is like an electric shock.  She is not interested in trying any medication for the symptoms but prefers to be seen and evaluated by neurology.  She requests referral today.  Is also here to get her cholesterol checked.  Reports she has been taking Lipitor 80 mg a day and TriCor p.o. daily.  Denies any myalgias or side effects.  Is not due to get her blood sugars rechecked again.  Reports she has been taking all of her medications and eating healthier.  Denies any hypoglycemic episodes.  Reports blood pressures been running well.  Denies any chest pain, shortness of breath, or swelling in her extremities. Is here to get her pneumonia shot today and would like a flu shot. Reports that she feels good.  She feels like her energy is improved.  She feels like she has made some good changes in her health status.  She is still smoking and is trying to quit.   Past Medical History:  Diagnosis Date  . AMI (acute myocardial infarction) (HCC) 2006   "mild"  . Diabetes mellitus without complication (HCC)   . Hyperlipidemia   . Hypertension     Past Surgical History:  Procedure Laterality Date  . ABDOMINAL HYSTERECTOMY     due to menorrhagia  . APPENDECTOMY    . CESAREAN SECTION    . CHOLECYSTECTOMY    . JOINT REPLACEMENT     total left knee  . LUNG BIOPSY      Family History  Problem Relation Age of Onset  . Diabetes Mother   . Glaucoma Mother   . Hypertension Mother   . Hypertension Father   . Diabetes Father   . Heart disease Father   . Glaucoma Father   . Throat cancer Father   . Kidney disease Brother   . Diabetes Brother   . Hypertension Brother   . Hypertension Brother   . Diabetes Brother   . Colon cancer Neg Hx   . Gastric cancer Neg Hx   . Esophageal cancer Neg Hx      Social History   Socioeconomic History  . Marital status: Single    Spouse name: Not on file  . Number of children: 4  . Years of education: 72  . Highest education level: 12th grade  Occupational History  . Not on file  Social Needs  . Financial resource strain: Not on file  .  Food insecurity:    Worry: Not on file    Inability: Not on file  . Transportation needs:    Medical: Not on file    Non-medical: Not on file  Tobacco Use  . Smoking status: Current Every Day Smoker    Packs/day: 1.00    Years: 34.00    Pack years: 34.00    Types: Cigarettes  . Smokeless tobacco: Never Used  Substance and Sexual Activity  . Alcohol use: No    Frequency: Never  . Drug use: No  . Sexual activity: Yes    Birth control/protection: Surgical  Lifestyle  . Physical activity:    Days per week: Not on file    Minutes per session: Not on file  . Stress: Not on file  Relationships  . Social connections:    Talks on phone: Not on file    Gets together: Not on file    Attends religious service: Not on file    Active member of club or organization: Not on file    Attends meetings of clubs or organizations: Not on file     Relationship status: Not on file  Other Topics Concern  . Not on file  Social History Narrative   Lives in [place], single but lives with man. Has children, 4. Grew up in [place] and moved to [place] in 2017. Moved to [place] in 2018.    Enjoys movies, tv.    Eats all food groups.   Wear seat belt.   Drives.    Does not go to church.    Current Outpatient Medications on File Prior to Visit  Medication Sig Dispense Refill  . albuterol (PROAIR HFA) 108 (90 Base) MCG/ACT inhaler Inhale into the lungs every 6 (six) hours as needed for wheezing or shortness of breath.    Marland Kitchen. aluminum chloride (DRYSOL) 20 % external solution Apply topically at bedtime. 35 mL 0  . amLODipine (NORVASC) 10 MG tablet Take 1 tablet (10 mg total) by mouth daily. 90 tablet 1  . atorvastatin (LIPITOR) 80 MG tablet Take 1 tablet (80 mg total) by mouth daily. 90 tablet 0  . diclofenac sodium (VOLTAREN) 1 % GEL Apply topically 4 (four) times daily.    . diphenoxylate-atropine (LOMOTIL) 2.5-0.025 MG tablet Take 1 tablet by mouth 4 (four) times daily as needed for diarrhea or loose stools. 30 tablet 2  . fenofibrate (TRICOR) 145 MG tablet TAKE 1 TABLET BY MOUTH EVERY DAY 30 tablet 2  . glipiZIDE (GLIPIZIDE XL) 10 MG 24 hr tablet Take 1 tablet (10 mg total) by mouth daily with breakfast. 90 tablet 1  . HYDROcodone-acetaminophen (NORCO/VICODIN) 5-325 MG tablet Take 1 tablet by mouth every 6 (six) hours as needed. 5 tablet 0  . lisinopril (PRINIVIL,ZESTRIL) 40 MG tablet Take 1 tablet (40 mg total) by mouth daily. 90 tablet 3  . loperamide (IMODIUM A-D) 2 MG tablet Take 2 mg by mouth as needed for diarrhea or loose stools.    . metFORMIN (GLUCOPHAGE) 1000 MG tablet Take 1 tablet (1,000 mg total) by mouth 2 (two) times daily. 60 tablet 3  . metoprolol succinate (TOPROL-XL) 50 MG 24 hr tablet Take 1 tablet (50 mg total) by mouth daily. Take with or immediately following a meal. 90 tablet 3  . montelukast (SINGULAIR) 10 MG tablet Take 1 tablet  (10 mg total) by mouth at bedtime. 90 tablet 1  . omeprazole (PRILOSEC) 40 MG capsule Take 1 capsule (40 mg total) by mouth daily. 30  capsule 3  . triamterene-hydrochlorothiazide (MAXZIDE-25) 37.5-25 MG tablet Take 1 tablet by mouth daily. 90 tablet 3  . varenicline (CHANTIX CONTINUING MONTH PAK) 1 MG tablet Take 1 tablet (1 mg total) by mouth 2 (two) times daily. 60 tablet 1   No current facility-administered medications on file prior to visit.     Review of Systems  Constitutional: Negative for activity change, appetite change and fever.  HENT: Negative for trouble swallowing and voice change.   Eyes: Negative for visual disturbance.  Respiratory: Negative for cough, chest tightness, shortness of breath and wheezing.   Cardiovascular: Negative for chest pain, palpitations and leg swelling.  Gastrointestinal: Negative for abdominal pain, nausea and vomiting.  Genitourinary: Negative for dysuria, frequency and urgency.  Musculoskeletal: Negative for myalgias.  Neurological: Positive for numbness. Negative for dizziness, syncope and light-headedness.       Patient reports chronic numbness and pain over her left hand, bilateral feet, bilateral ankles, and right lateral knee region.   Hematological: Negative for adenopathy.     Objective:   BP 118/72 (BP Location: Left Arm, Patient Position: Sitting, Cuff Size: Large)   Pulse 70   Temp 98.1 F (36.7 C) (Temporal)   Resp 12   Ht 5\' 5"  (1.651 m)   Wt 188 lb (85.3 kg)   SpO2 94% Comment: room air  BMI 31.28 kg/m   Physical Exam  Constitutional: She is oriented to person, place, and time. She appears well-developed and well-nourished. No distress.  HENT:  Head: Normocephalic and atraumatic.  Mouth/Throat: Oropharynx is clear and moist.  Eyes: Pupils are equal, round, and reactive to light. Conjunctivae are normal. No scleral icterus.  Neck: Normal range of motion. Neck supple. No thyromegaly present.  Cardiovascular: Normal rate,  regular rhythm and normal heart sounds.  Pulmonary/Chest: Effort normal and breath sounds normal. No respiratory distress.  Neurological: She is alert and oriented to person, place, and time. She displays normal reflexes. A sensory deficit is present. No cranial nerve deficit. She exhibits normal muscle tone. Coordination normal.  Patient reports decreased sensation in skin of her hands, left side decrease in sensation greater than right.  Patient reports hyperesthesia in feet bilaterally.  Skin: Skin is warm and dry.  Psychiatric: She has a normal mood and affect. Her behavior is normal. Judgment and thought content normal.  Nursing note and vitals reviewed.    Assessment and Plan  1. Neuropathy Chronic history of neuropathy, no prior evaluation by neurology.  Labs are reviewed in the office today.  She has had a HIV, syphilis, and electrolyte studies which are within normal limits.  Her thyroid function is also been checked and it is normal.  Will obtain B12 levels at this time.  She defers trial of any medication for her symptoms.  Referral is placed to neurology. - Vitamin B12 - Ambulatory referral to Neurology  2. Essential hypertension Table.  Continue current medications. Lifestyle modifications discussed with patient including a diet emphasizing vegetables, fruits, and whole grains. Limiting intake of sodium to less than 2,400 mg per day.  Recommendations discussed include consuming low-fat dairy products, poultry, fish, legumes, non-tropical vegetable oils, and nuts; and limiting intake of sweets, sugar-sweetened beverages, and red meat. Discussed following a plan such as the Dietary Approaches to Stop Hypertension (DASH) diet. Patient to read up on this diet.    3. Type 2 diabetes mellitus with complication, without long-term current use of insulin (HCC) Check labs at the end of September.  Hemoglobin A1c has  improved.  We did discuss her goal A1c today.  Dietary modifications  recommended.  Exercise recommended.  She does report that [employer] is opening up near her home and she plans on joining this facility. - Hepatic function panel - Pneumococcal polysaccharide vaccine 23-valent greater than or equal to 2yo subcutaneous/IM  4. Hyperlipidemia associated with type 2 diabetes mellitus (HCC) Check for improvement.  She does report compliance with her medications.  She is at max dose of Lipitor and TriCor.  If LDL is not closer to goal consider switching medication to Crestor. Hyperlipidemia and the associated risk of ASCVD were discussed today. Primary vs. Secondary prevention of ASCVD were discussed and how it relates to patient morbidity, mortality, and quality of life. Shared decision making with patient including the risks of statins vs.benefits of ASCVD risk reduction discussed.  Risks of stains discussed including myopathy, rhabdomyoloysis, liver problems, increased risk of diabetes discussed. We discussed heart healthy diet, lifestyle modifications, risk factor modifications, and adherence to the recommended treatment plan. We discussed the need to periodically monitor lipid panel and liver function tests while on statin therapy.   - Lipid panel  5. Need for immunization against influenza - Flu Vaccine QUAD 36+ mos IM Tobacco cessation recommended.  Risk of tobacco use discussed again with patient today. No follow-ups on file.  Recommended follow-up with new PCP 2 to 3 months or sooner if needed. Aliene Beams, MD 09/15/2017

## 2017-09-15 NOTE — Patient Instructions (Signed)
Steps to Quit Smoking Smoking tobacco can be bad for your health. It can also affect almost every organ in your body. Smoking puts you and people around you at risk for many serious long-lasting (chronic) diseases. Quitting smoking is hard, but it is one of the best things that you can do for your health. It is never too late to quit. What are the benefits of quitting smoking? When you quit smoking, you lower your risk for getting serious diseases and conditions. They can include:  Lung cancer or lung disease.  Heart disease.  Stroke.  Heart attack.  Not being able to have children (infertility).  Weak bones (osteoporosis) and broken bones (fractures).  If you have coughing, wheezing, and shortness of breath, those symptoms may get better when you quit. You may also get sick less often. If you are pregnant, quitting smoking can help to lower your chances of having a baby of low birth weight. What can I do to help me quit smoking? Talk with your doctor about what can help you quit smoking. Some things you can do (strategies) include:  Quitting smoking totally, instead of slowly cutting back how much you smoke over a period of time.  Going to in-person counseling. You are more likely to quit if you go to many counseling sessions.  Using resources and support systems, such as: ? Online chats with a counselor. ? Phone quitlines. ? Printed self-help materials. ? Support groups or group counseling. ? Text messaging programs. ? Mobile phone apps or applications.  Taking medicines. Some of these medicines may have nicotine in them. If you are pregnant or breastfeeding, do not take any medicines to quit smoking unless your doctor says it is okay. Talk with your doctor about counseling or other things that can help you.  Talk with your doctor about using more than one strategy at the same time, such as taking medicines while you are also going to in-person counseling. This can help make  quitting easier. What things can I do to make it easier to quit? Quitting smoking might feel very hard at first, but there is a lot that you can do to make it easier. Take these steps:  Talk to your family and friends. Ask them to support and encourage you.  Call phone quitlines, reach out to support groups, or work with a counselor.  Ask people who smoke to not smoke around you.  Avoid places that make you want (trigger) to smoke, such as: ? Bars. ? Parties. ? Smoke-break areas at work.  Spend time with people who do not smoke.  Lower the stress in your life. Stress can make you want to smoke. Try these things to help your stress: ? Getting regular exercise. ? Deep-breathing exercises. ? Yoga. ? Meditating. ? Doing a body scan. To do this, close your eyes, focus on one area of your body at a time from head to toe, and notice which parts of your body are tense. Try to relax the muscles in those areas.  Download or buy apps on your mobile phone or tablet that can help you stick to your quit plan. There are many free apps, such as QuitGuide from the CDC (Centers for Disease Control and Prevention). You can find more support from smokefree.gov and other websites.  This information is not intended to replace advice given to you by your health care provider. Make sure you discuss any questions you have with your health care provider. Document Released: 10/17/2008 Document   Revised: 08/19/2015 Document Reviewed: 05/07/2014 Elsevier Interactive Patient Education  2018 Elsevier Inc.  

## 2017-09-16 LAB — LIPID PANEL
Cholesterol: 180 mg/dL (ref <200)
HDL: 39 mg/dL — ABNORMAL LOW (ref >50)
LDL Cholesterol (Calc): 112 mg/dL (calc) — ABNORMAL HIGH
Non-HDL Cholesterol (Calc): 141 mg/dL (calc) — ABNORMAL HIGH (ref <130)
Total CHOL/HDL Ratio: 4.6 (calc) (ref <5.0)
Triglycerides: 169 mg/dL — ABNORMAL HIGH (ref <150)

## 2017-09-16 LAB — HEPATIC FUNCTION PANEL
AG Ratio: 1.4 (calc) (ref 1.0–2.5)
ALT: 19 U/L (ref 6–29)
AST: 21 U/L (ref 10–35)
Albumin: 4.5 g/dL (ref 3.6–5.1)
Alkaline phosphatase (APISO): 90 U/L (ref 33–130)
Bilirubin, Direct: 0.1 mg/dL (ref 0.0–0.2)
Globulin: 3.2 g/dL (calc) (ref 1.9–3.7)
Indirect Bilirubin: 0.2 mg/dL (calc) (ref 0.2–1.2)
Total Bilirubin: 0.3 mg/dL (ref 0.2–1.2)
Total Protein: 7.7 g/dL (ref 6.1–8.1)

## 2017-09-16 LAB — VITAMIN B12: Vitamin B-12: 413 pg/mL (ref 200–1100)

## 2017-09-20 ENCOUNTER — Encounter: Payer: Self-pay | Admitting: Podiatry

## 2017-09-20 ENCOUNTER — Ambulatory Visit (INDEPENDENT_AMBULATORY_CARE_PROVIDER_SITE_OTHER): Payer: Medicare Other | Admitting: Podiatry

## 2017-09-20 DIAGNOSIS — Z9889 Other specified postprocedural states: Secondary | ICD-10-CM

## 2017-09-20 DIAGNOSIS — L600 Ingrowing nail: Secondary | ICD-10-CM

## 2017-09-20 NOTE — Patient Instructions (Signed)

## 2017-09-21 ENCOUNTER — Encounter: Payer: Self-pay | Admitting: Family Medicine

## 2017-09-21 NOTE — Progress Notes (Signed)
Subjective: Tasha Aguilar is a 52 y.o.  female returns to office today for follow up evaluation after having bilateral Hallux medial and lateral nail avulsion performed. Patient has been soaking using epsom salts and applying topical antibiotic covered with bandaid daily.  She is tolerating clindamycin well.  She has some pain to the right hallux but overall it is improving. Denies any drainage or pus. Patient denies fevers, chills, nausea, vomiting. Denies any calf pain, chest pain, SOB.   Objective:  Vitals: Reviewed  General: Well developed, nourished, in no acute distress, alert and oriented x3   Dermatology: Skin is warm, dry and supple bilateral. Bilateral hallux nail border appears to be clean, dry, with mild granular tissue and surrounding scab. There is no fait surrounding erythema and edema to the right hallux nail site however there is no drainage/purulence. Overall the toenails, mostly the right hallux toenail, is hypertrophic, dystrophyic with brown discoloration. The remaining nails appear unremarkable at this time. There are no other lesions or other signs of infection present.  Neurovascular status: Intact. No lower extremity swelling; No pain with calf compression bilateral.  Musculoskeletal: Decreased tenderness to palpation of the righthallux nail fold and no pain on the left. Muscular strength within normal limits bilateral.   Assesement and Plan: S/p partial nail avulsion, doing well.   -Continue soaking in epsom salts twice a day followed by antibiotic ointment and a band-aid. Can leave uncovered at night. Continue this until completely healed.  -Discussed we may need to remove the entire toenail on the right side. She is concerned about it "pinching". The toenail overall is dystrophic and curved but the corners have been removed. If it continues to become problematic then it needs to be removed.  -If the area has not healed in 2 weeks, call the office for follow-up  appointment, or sooner if any problems arise.  -Monitor for any signs/symptoms of infection. Call the office immediately if any occur or go directly to the emergency room. Call with any questions/concerns.  Ovid CurdMatthew Bronson Bressman, DPM

## 2017-09-23 ENCOUNTER — Other Ambulatory Visit: Payer: Self-pay | Admitting: Family Medicine

## 2017-09-23 ENCOUNTER — Other Ambulatory Visit: Payer: Self-pay | Admitting: Nurse Practitioner

## 2017-09-23 DIAGNOSIS — K581 Irritable bowel syndrome with constipation: Secondary | ICD-10-CM

## 2017-09-23 DIAGNOSIS — K219 Gastro-esophageal reflux disease without esophagitis: Secondary | ICD-10-CM

## 2017-09-23 DIAGNOSIS — Z720 Tobacco use: Secondary | ICD-10-CM

## 2017-09-28 ENCOUNTER — Other Ambulatory Visit: Payer: Self-pay | Admitting: Nurse Practitioner

## 2017-09-28 DIAGNOSIS — K581 Irritable bowel syndrome with constipation: Secondary | ICD-10-CM

## 2017-09-28 DIAGNOSIS — K219 Gastro-esophageal reflux disease without esophagitis: Secondary | ICD-10-CM

## 2017-10-03 ENCOUNTER — Encounter: Payer: Self-pay | Admitting: Family Medicine

## 2017-10-04 ENCOUNTER — Encounter: Payer: Self-pay | Admitting: Family Medicine

## 2017-10-05 DIAGNOSIS — B029 Zoster without complications: Secondary | ICD-10-CM | POA: Diagnosis not present

## 2017-10-05 DIAGNOSIS — Z7984 Long term (current) use of oral hypoglycemic drugs: Secondary | ICD-10-CM | POA: Diagnosis not present

## 2017-10-05 DIAGNOSIS — Z79899 Other long term (current) drug therapy: Secondary | ICD-10-CM | POA: Diagnosis not present

## 2017-10-05 DIAGNOSIS — R21 Rash and other nonspecific skin eruption: Secondary | ICD-10-CM | POA: Diagnosis not present

## 2017-10-06 ENCOUNTER — Other Ambulatory Visit: Payer: Self-pay

## 2017-10-06 NOTE — Patient Outreach (Signed)
[facility]) Care Management  10/06/2017  Tasha Aguilar Aug 07, 1965 [id number]   Medication Adherence call to Mrs. Tasha Aguilar left a message for patient to call back patient is due on Lisinopril 40 mg and Atorvastatin 80 mg. Tasha Aguilar is showing past due under [facility].   Lillia Abed CPhT Pharmacy Technician [facility] Care Management Direct Dial [phone]  Fax [phone] [email or web address]

## 2017-10-07 ENCOUNTER — Other Ambulatory Visit: Payer: Self-pay | Admitting: Family Medicine

## 2017-10-18 DIAGNOSIS — I10 Essential (primary) hypertension: Secondary | ICD-10-CM | POA: Diagnosis not present

## 2017-10-18 DIAGNOSIS — E785 Hyperlipidemia, unspecified: Secondary | ICD-10-CM | POA: Diagnosis not present

## 2017-10-18 DIAGNOSIS — B0223 Postherpetic polyneuropathy: Secondary | ICD-10-CM | POA: Diagnosis not present

## 2017-10-18 DIAGNOSIS — E1140 Type 2 diabetes mellitus with diabetic neuropathy, unspecified: Secondary | ICD-10-CM | POA: Diagnosis not present

## 2017-10-18 DIAGNOSIS — R002 Palpitations: Secondary | ICD-10-CM | POA: Diagnosis not present

## 2017-10-27 ENCOUNTER — Ambulatory Visit: Payer: Medicare Other | Admitting: Gastroenterology

## 2017-10-31 ENCOUNTER — Ambulatory Visit: Payer: Medicare Other | Admitting: Nurse Practitioner

## 2017-11-01 ENCOUNTER — Ambulatory Visit (INDEPENDENT_AMBULATORY_CARE_PROVIDER_SITE_OTHER): Payer: Medicare Other | Admitting: Cardiology

## 2017-11-01 ENCOUNTER — Encounter: Payer: Self-pay | Admitting: Cardiology

## 2017-11-01 VITALS — BP 120/78 | HR 79 | Ht 66.0 in | Wt 188.0 lb

## 2017-11-01 DIAGNOSIS — F17210 Nicotine dependence, cigarettes, uncomplicated: Secondary | ICD-10-CM

## 2017-11-01 DIAGNOSIS — R002 Palpitations: Secondary | ICD-10-CM | POA: Diagnosis not present

## 2017-11-01 DIAGNOSIS — Z720 Tobacco use: Secondary | ICD-10-CM

## 2017-11-01 DIAGNOSIS — Z716 Tobacco abuse counseling: Secondary | ICD-10-CM | POA: Diagnosis not present

## 2017-11-01 NOTE — Progress Notes (Signed)
Electrophysiology Office Note   Date:  11/01/2017   ID:  Tasha Aguilar, DOB 15-Jun-1965, MRN [id number]  PCP:  Aliene Beams, MD  Cardiologist:   Primary Electrophysiologist:  Rashaad Hallstrom Jorja Loa, MD    No chief complaint on file.    History of Present Illness: Tasha Aguilar is a 52 y.o. female who is being seen today for the evaluation of palpitations at the request of Aliene Beams, MD. Presenting today for electrophysiology evaluation.  She has a history of diabetes, hypertension, hyperlipidemia.  Today, she denies symptoms of chest pain, shortness of breath, orthopnea, PND, lower extremity edema, claudication, dizziness, presyncope, syncope, bleeding, or neurologic sequela. The patient is tolerating medications without difficulties.  She has been having palpitations for many years.  It does seem that her palpitations have worsened over the last few months.  She has palpitations a few times per week.  Her palpitations have no exacerbating or alleviating factors.  She does not have any chest pain or shortness of breath associated with palpitations.  Last few seconds up to a minute at a time.   Past Medical History:  Diagnosis Date  . Abdominal pain 07/27/2017  . AMI (acute myocardial infarction) (HCC) 2006   "mild"  . Bronchitis 02/19/2017  . Diabetes mellitus without complication (HCC)   . Essential hypertension 02/19/2017  . Fatigue 02/19/2017  . GERD (gastroesophageal reflux disease) 05/02/2017  . Hyperlipidemia   . Hyperlipidemia LDL goal <70 02/19/2017  . Hypertension   . IBS (irritable bowel syndrome) 07/27/2017  . Ingrown toenail 04/27/2017  . Irritable bowel syndrome with constipation 02/19/2017  . Obesity (BMI 30.0-34.9) 04/27/2017  . Primary osteoarthritis of right knee 04/27/2017  . Tobacco abuse counseling 04/27/2017  . Type 2 diabetes mellitus with complication, without long-term current use of insulin (HCC) 02/19/2017  . Vitamin D deficiency 02/19/2017   Past  Surgical History:  Procedure Laterality Date  . ABDOMINAL HYSTERECTOMY     due to menorrhagia  . APPENDECTOMY    . CESAREAN SECTION    . CHOLECYSTECTOMY    . JOINT REPLACEMENT     total left knee  . LUNG BIOPSY       Current Outpatient Medications  Medication Sig Dispense Refill  . albuterol (PROAIR HFA) 108 (90 Base) MCG/ACT inhaler Inhale into the lungs every 6 (six) hours as needed for wheezing or shortness of breath.    Marland Kitchen aluminum chloride (DRYSOL) 20 % external solution Apply topically at bedtime. 35 mL 0  . amLODipine (NORVASC) 10 MG tablet Take 1 tablet (10 mg total) by mouth daily. 90 tablet 1  . atorvastatin (LIPITOR) 80 MG tablet Take 1 tablet (80 mg total) by mouth daily. 90 tablet 0  . CHANTIX CONTINUING MONTH PAK 1 MG tablet TAKE 1 TABLET BY MOUTH TWICE A DAY 60 tablet 1  . diclofenac sodium (VOLTAREN) 1 % GEL Apply topically 4 (four) times daily.    . diphenoxylate-atropine (LOMOTIL) 2.5-0.025 MG tablet Take 1 tablet by mouth 4 (four) times daily as needed for diarrhea or loose stools. 30 tablet 2  . fenofibrate (TRICOR) 145 MG tablet TAKE 1 TABLET BY MOUTH EVERY DAY 30 tablet 2  . glipiZIDE (GLIPIZIDE XL) 10 MG 24 hr tablet Take 1 tablet (10 mg total) by mouth daily with breakfast. 90 tablet 1  . HYDROcodone-acetaminophen (NORCO/VICODIN) 5-325 MG tablet Take 1 tablet by mouth every 6 (six) hours as needed. 5 tablet 0  . lisinopril (PRINIVIL,ZESTRIL) 40 MG tablet Take 1 tablet (  40 mg total) by mouth daily. 90 tablet 3  . loperamide (IMODIUM A-D) 2 MG tablet Take 2 mg by mouth as needed for diarrhea or loose stools.    . metFORMIN (GLUCOPHAGE) 1000 MG tablet Take 1 tablet (1,000 mg total) by mouth 2 (two) times daily. 60 tablet 3  . metoprolol succinate (TOPROL-XL) 50 MG 24 hr tablet Take 1 tablet (50 mg total) by mouth daily. Take with or immediately following a meal. 90 tablet 3  . montelukast (SINGULAIR) 10 MG tablet Take 1 tablet (10 mg total) by mouth at bedtime. 90  tablet 1  . omeprazole (PRILOSEC) 40 MG capsule TAKE 1 CAPSULE BY MOUTH EVERY DAY 30 capsule 5  . triamterene-hydrochlorothiazide (MAXZIDE-25) 37.5-25 MG tablet Take 1 tablet by mouth daily. 90 tablet 3   No current facility-administered medications for this visit.     Allergies:   Latex   Social History:  The patient  reports that she has been smoking cigarettes. She has a 34.00 pack-year smoking history. She has never used smokeless tobacco. She reports that she does not drink alcohol or use drugs.   Family History:  The patient's family history includes Diabetes in her brother, brother, father, and mother; Glaucoma in her father and mother; Heart disease in her father; Hypertension in her brother, brother, father, and mother; Kidney disease in her brother; Throat cancer in her father.    ROS:  Please see the history of present illness.   Otherwise, review of systems is positive for cough, SOB, wheezing.   All other systems are reviewed and negative.    PHYSICAL EXAM: VS:  BP 120/78   Pulse 79   Ht 5\' 6"  (1.676 m)   Wt 188 lb (85.3 kg)   SpO2 98%   BMI 30.34 kg/m  , BMI Body mass index is 30.34 kg/m. GEN: Well nourished, well developed, in no acute distress  HEENT: normal  Neck: no JVD, carotid bruits, or masses Cardiac: RRR; no murmurs, rubs, or gallops,no edema  Respiratory:  clear to auscultation bilaterally, normal work of breathing GI: soft, nontender, nondistended, + BS MS: no deformity or atrophy  Skin: warm and dry Neuro:  Strength and sensation are intact Psych: euthymic mood, full affect  EKG:  EKG is ordered today. Personal review of the ekg ordered shows SR, rate 79  Recent Labs: 02/18/2017: Hemoglobin 12.9; Platelets 340; TSH 1.26 07/01/2017: BUN 24; Creat 1.09; Potassium 4.4; Sodium 141 09/15/2017: ALT 19    Lipid Panel     Component Value Date/Time   CHOL 180 09/15/2017 1055   TRIG 169 (H) 09/15/2017 1055   HDL 39 (L) 09/15/2017 1055   CHOLHDL 4.6  09/15/2017 1055   LDLCALC 112 (H) 09/15/2017 1055     Wt Readings from Last 3 Encounters:  11/01/17 188 lb (85.3 kg)  09/15/17 188 lb (85.3 kg)  08/12/17 187 lb 1.3 oz (84.9 kg)      Other studies Reviewed: Additional studies/ records that were reviewed today include: PCP notes  ASSESSMENT AND PLAN:  1.  Palpitations: She has a few seconds with the palpitations a few times a week.  These are quite concerning to her.  At this point it is likely that it is due to simply PACs or PVCs, but SVT cannot be totally ruled out.  She does take metoprolol and she has not noted any changes with this medication.  We Amadea Keagy thus get a 14-day monitor for further evaluation.  We Millenia Waldvogel work to get  records from her primary cardiologist in [place].  2.  Tobacco abuse: Smokes approximately a pack of cigarettes a day.  Had a long discussion on how to stop smoking and the fact that she should likely take her Chantix.  >10 minutes was spent on this conversation.    Current medicines are reviewed at length with the patient today.   The patient does not have concerns regarding her medicines.  The following changes were made today:  none  Labs/ tests ordered today include:  Orders Placed This Encounter  Procedures  . LONG TERM MONITOR (3-14 DAYS)  . EKG 12-Lead     Disposition:   FU with Louisa Favaro 1 months  Signed, Ulysses Alper Jorja Loa, MD  11/01/2017 11:00 AM     [facility] [address] [place] [address] [phone] (office) [phone] (fax)

## 2017-11-01 NOTE — Patient Instructions (Signed)
Medication Instructions:  Your physician recommends that you continue on your current medications as directed. Please refer to the Current Medication list given to you today.  If you need a refill on your cardiac medications before your next appointment, please call your pharmacy.   Lab work: None ordered  If you have labs (blood work) drawn today and your tests are completely normal, you will receive your results only by: Marland Kitchen MyChart Message (if you have MyChart) OR . A paper copy in the mail If you have any lab test that is abnormal or we need to change your treatment, we will call you to review the results.  Testing/Procedures: Your physician has recommended that you wear a 14 day holter monitor. Event monitors are medical devices that record the heart's electrical activity. Doctors most often [place] these monitors to diagnose arrhythmias. Arrhythmias are problems with the speed or rhythm of the heartbeat. The monitor is a small, portable device. You can wear one while you do your normal daily activities. This is usually used to diagnose what is causing palpitations/syncope (passing out).  Follow-Up: Your physician recommends that you schedule a follow-up appointment in: 4 weeks, after holter monitor hs been completed, with Dr. Elberta Fortis.  Thank you for choosing [facility]!!   Dory Horn, RN [phone]  Any Other Special Instructions Will Be Listed Below (If Applicable).  Cardiac Event Monitoring A cardiac event monitor is a small recording device that is used to detect abnormal heart rhythms (arrhythmias). The monitor is used to record your heart rhythm when you have symptoms, such as:  Fast heartbeats (palpitations), such as heart racing or fluttering.  Dizziness.  Fainting or light-headedness.  Unexplained weakness.  Some monitors are wired to electrodes placed on your chest. Electrodes are flat, sticky disks that attach to your skin. Other monitors may be hand-held or  worn on the wrist. The monitor can be worn for up to 30 days. If the monitor is attached to your chest, a technician will prepare your chest for the electrode placement and show you how to work the monitor. Take time to practice using the monitor before you leave the office. Make sure you understand how to send the information from the monitor to your health care provider. In some cases, you may need to use a landline telephone instead of a cell phone. What are the risks? Generally, this device is safe to use, but it possible that the skin under the electrodes will become irritated. How to use your cardiac event monitor  Wear your monitor at all times, except when you are in water: ? Do not let the monitor get wet. ? Take the monitor off when you bathe. Do not swim or use a hot tub with it on.  Keep your skin clean. Do not put body lotion or moisturizer on your chest.  Change the electrodes as told by your health care provider or any time they stop sticking to your skin. You may need to use medical tape to keep them on.  Try to put the electrodes in slightly different places on your chest to help prevent skin irritation. They must remain in the area under your left breast and in the upper right section of your chest.  Make sure the monitor is safely clipped to your clothing or in a location close to your body that your health care provider recommends.  Press the button to record as soon as you feel heart-related symptoms, such as: ? Dizziness. ? Weakness. ?  Light-headedness. ? Palpitations. ? Thumping or pounding in your chest. ? Shortness of breath. ? Unexplained weakness.  Keep a diary of your activities, such as walking, doing chores, and taking medicine. It is very important to note what you were doing when you pushed the button to record your symptoms. This will help your health care provider determine what might be contributing to your symptoms.  Send the recorded information as  recommended by your health care provider. It may take some time for your health care provider to process the results.  Change the batteries as told by your health care provider.  Keep electronic devices away from your monitor. This includes: ? Tablets. ? MP3 players. ? Cell phones.  While wearing your monitor you should avoid: ? Electric blankets. ? Armed forces operational officer. ? Electric toothbrushes. ? Microwave ovens. ? Magnets. ? Metal detectors. Get help right away if:  You have chest pain.  You have extreme difficulty breathing or shortness of breath.  You develop a very fast heartbeat that persists.  You develop dizziness that does not go away.  You faint or constantly feel like you are about to faint. Summary  A cardiac event monitor is a small recording device that is used to help detect abnormal heart rhythms (arrhythmias).  The monitor is used to record your heart rhythm when you have heart-related symptoms.  Make sure you understand how to send the information from the monitor to your health care provider.  It is important to press the button on the monitor when you have any heart-related symptoms.  Keep a diary of your activities, such as walking, doing chores, and taking medicine. It is very important to note what you were doing when you pushed the button to record your symptoms. This will help your health care provider learn what might be causing your symptoms. This information is not intended to replace advice given to you by your health care provider. Make sure you discuss any questions you have with your health care provider. Document Released: 09/30/2007 Document Revised: 12/06/2015 Document Reviewed: 12/06/2015 Elsevier Interactive Patient Education  2017 [employer].

## 2017-11-02 ENCOUNTER — Encounter (INDEPENDENT_AMBULATORY_CARE_PROVIDER_SITE_OTHER): Payer: Medicare Other

## 2017-11-02 DIAGNOSIS — R002 Palpitations: Secondary | ICD-10-CM | POA: Diagnosis not present

## 2017-11-04 DIAGNOSIS — R7989 Other specified abnormal findings of blood chemistry: Secondary | ICD-10-CM | POA: Diagnosis not present

## 2017-11-06 DIAGNOSIS — R002 Palpitations: Secondary | ICD-10-CM | POA: Diagnosis not present

## 2017-11-16 ENCOUNTER — Encounter: Payer: Self-pay | Admitting: Diagnostic Neuroimaging

## 2017-11-16 ENCOUNTER — Ambulatory Visit (INDEPENDENT_AMBULATORY_CARE_PROVIDER_SITE_OTHER): Payer: Medicare Other | Admitting: Diagnostic Neuroimaging

## 2017-11-16 VITALS — BP 122/78 | HR 62 | Ht 65.0 in | Wt 189.8 lb

## 2017-11-16 DIAGNOSIS — E1142 Type 2 diabetes mellitus with diabetic polyneuropathy: Secondary | ICD-10-CM | POA: Diagnosis not present

## 2017-11-16 NOTE — Patient Instructions (Signed)
-   improve diabetes control  - consider multi-vitamin and alpha-lipoic acid  - consider gabapentin (for pain control)

## 2017-11-16 NOTE — Progress Notes (Signed)
[facility]  PATIENT: Tasha Aguilar DOB: 10-15-1965  REFERRING CLINICIAN: R Hagler HISTORY FROM: patient  REASON FOR VISIT: new consult    HISTORICAL  CHIEF COMPLAINT:  Chief Complaint  Patient presents with  . New Patient (Initial Visit)    Rm 7, alone  . Referred by Dr. Aliene Beams    Neuropathy (noted for years, has progressivley gotten worse ( L hand, r arm, toes, outer bilateral legs.    HISTORY OF PRESENT ILLNESS:   52 year old female here for evaluation of numbness, pain, tingling.  Patient has history of diabetes, diagnosed 5 to 6 years ago.  Around the time she had mild numbness and tingling sensation.  Over time symptoms have worsened.  In the last 1 year she is having migratory pain, tingling, needle sensation in her arms and legs.  Hemoglobin A1c in February 2019 was 10.  This has slightly improved to 7.2 in June 2019.   REVIEW OF SYSTEMS: Full 14 system review of systems performed and negative with exception of: Numbness weakness snoring joint pain aching muscles palpitations.  Hypertension diabetes hypercholesteremia.   ALLERGIES: Allergies  Allergen Reactions  . Latex Other (See Comments)    Blister    HOME MEDICATIONS: Outpatient Medications Prior to Visit  Medication Sig Dispense Refill  . albuterol (PROAIR HFA) 108 (90 Base) MCG/ACT inhaler Inhale into the lungs every 6 (six) hours as needed for wheezing or shortness of breath.    Marland Kitchen aluminum chloride (DRYSOL) 20 % external solution Apply topically at bedtime. 35 mL 0  . amLODipine (NORVASC) 10 MG tablet Take 1 tablet (10 mg total) by mouth daily. 90 tablet 1  . atorvastatin (LIPITOR) 80 MG tablet Take 1 tablet (80 mg total) by mouth daily. 90 tablet 0  . CHANTIX CONTINUING MONTH PAK 1 MG tablet TAKE 1 TABLET BY MOUTH TWICE A DAY 60 tablet 1  . diclofenac sodium (VOLTAREN) 1 % GEL Apply topically 4 (four) times daily.    . fenofibrate (TRICOR) 145 MG tablet TAKE 1 TABLET BY MOUTH  EVERY DAY 30 tablet 2  . glipiZIDE (GLIPIZIDE XL) 10 MG 24 hr tablet Take 1 tablet (10 mg total) by mouth daily with breakfast. 90 tablet 1  . lisinopril (PRINIVIL,ZESTRIL) 40 MG tablet Take 1 tablet (40 mg total) by mouth daily. 90 tablet 3  . loperamide (IMODIUM A-D) 2 MG tablet Take 2 mg by mouth as needed for diarrhea or loose stools.    . metFORMIN (GLUCOPHAGE) 1000 MG tablet Take 1 tablet (1,000 mg total) by mouth 2 (two) times daily. 60 tablet 3  . metoprolol succinate (TOPROL-XL) 50 MG 24 hr tablet Take 1 tablet (50 mg total) by mouth daily. Take with or immediately following a meal. 90 tablet 3  . montelukast (SINGULAIR) 10 MG tablet Take 1 tablet (10 mg total) by mouth at bedtime. 90 tablet 1  . omeprazole (PRILOSEC) 40 MG capsule TAKE 1 CAPSULE BY MOUTH EVERY DAY 30 capsule 5  . triamterene-hydrochlorothiazide (MAXZIDE-25) 37.5-25 MG tablet Take 1 tablet by mouth daily. 90 tablet 3  . diphenoxylate-atropine (LOMOTIL) 2.5-0.025 MG tablet Take 1 tablet by mouth 4 (four) times daily as needed for diarrhea or loose stools. 30 tablet 2  . HYDROcodone-acetaminophen (NORCO/VICODIN) 5-325 MG tablet Take 1 tablet by mouth every 6 (six) hours as needed. 5 tablet 0   No facility-administered medications prior to visit.     PAST MEDICAL HISTORY: Past Medical History:  Diagnosis Date  . Abdominal  pain 07/27/2017  . AMI (acute myocardial infarction) (HCC) 2006   "mild"  . Bronchitis 02/19/2017  . Diabetes mellitus without complication (HCC)   . Essential hypertension 02/19/2017  . Fatigue 02/19/2017  . GERD (gastroesophageal reflux disease) 05/02/2017  . Hyperlipidemia   . Hyperlipidemia LDL goal <70 02/19/2017  . Hypertension   . IBS (irritable bowel syndrome) 07/27/2017  . Ingrown toenail 04/27/2017  . Irritable bowel syndrome with constipation 02/19/2017  . Obesity (BMI 30.0-34.9) 04/27/2017  . Primary osteoarthritis of right knee 04/27/2017  . Tobacco abuse counseling 04/27/2017  . Type 2  diabetes mellitus with complication, without long-term current use of insulin (HCC) 02/19/2017  . Vitamin D deficiency 02/19/2017    PAST SURGICAL HISTORY: Past Surgical History:  Procedure Laterality Date  . ABDOMINAL HYSTERECTOMY     due to menorrhagia  . APPENDECTOMY    . CESAREAN SECTION     x 4  . CHOLECYSTECTOMY    . JOINT REPLACEMENT     total left knee  . LUNG BIOPSY      FAMILY HISTORY: Family History  Problem Relation Age of Onset  . Diabetes Mother   . Glaucoma Mother   . Hypertension Mother   . Heart attack Mother   . Hypertension Father   . Diabetes Father   . Heart disease Father   . Glaucoma Father   . Throat cancer Father   . Kidney disease Brother   . Diabetes Brother   . Hypertension Brother   . Hypertension Brother   . Diabetes Brother   . Cancer Brother        angiosarcoma  . Colon cancer Neg Hx   . Gastric cancer Neg Hx   . Esophageal cancer Neg Hx     SOCIAL HISTORY: Social History   Socioeconomic History  . Marital status: Single    Spouse name: Not on file  . Number of children: 4  . Years of education: 60  . Highest education level: 12th grade  Occupational History  . Not on file  Social Needs  . Financial resource strain: Not on file  . Food insecurity:    Worry: Not on file    Inability: Not on file  . Transportation needs:    Medical: Not on file    Non-medical: Not on file  Tobacco Use  . Smoking status: Current Every Day Smoker    Packs/day: 0.25    Years: 34.00    Pack years: 8.50    Types: Cigarettes  . Smokeless tobacco: Never Used  Substance and Sexual Activity  . Alcohol use: No    Frequency: Never  . Drug use: No  . Sexual activity: Yes    Birth control/protection: Surgical  Lifestyle  . Physical activity:    Days per week: Not on file    Minutes per session: Not on file  . Stress: Not on file  Relationships  . Social connections:    Talks on phone: Not on file    Gets together: Not on file    Attends  religious service: Not on file    Active member of club or organization: Not on file    Attends meetings of clubs or organizations: Not on file    Relationship status: Not on file  . Intimate partner violence:    Fear of current or ex partner: Not on file    Emotionally abused: Not on file    Physically abused: Not on file    Forced sexual  activity: Not on file  Other Topics Concern  . Not on file  Social History Narrative   Lives in [place], single but lives with man, Tommi Emery and son.  Has children, 4. Grew up in [place] and moved to [place] in 2017. Moved to [place] in 2018.    Enjoys movies, tv.    Eats all food groups.   Wear seat belt.   Drives.    Does not go to church.    Caffeine one cup 4 x week     PHYSICAL EXAM  GENERAL EXAM/CONSTITUTIONAL: Vitals:  Vitals:   11/16/17 0758  BP: 122/78  Pulse: 62  Weight: 189 lb 12.8 oz (86.1 kg)  Height: 5\' 5"  (1.651 m)     Body mass index is 31.58 kg/m. Wt Readings from Last 3 Encounters:  11/16/17 189 lb 12.8 oz (86.1 kg)  11/01/17 188 lb (85.3 kg)  09/15/17 188 lb (85.3 kg)     Patient is in no distress; well developed, nourished and groomed; neck is supple  CARDIOVASCULAR:  Examination of carotid arteries is normal; no carotid bruits  Regular rate and rhythm, no murmurs  Examination of peripheral vascular system by observation and palpation is normal  EYES:  Ophthalmoscopic exam of optic discs and posterior segments is normal; no papilledema or hemorrhages  Visual Acuity Screening   Right eye Left eye Both eyes  Without correction: 20/20 20/20   With correction:        MUSCULOSKELETAL:  Gait, strength, tone, movements noted in Neurologic exam below  NEUROLOGIC: MENTAL STATUS:  No flowsheet data found.  awake, alert, oriented to person, place and time  recent and remote memory intact  normal attention and concentration  language fluent, comprehension intact, naming intact  fund of  knowledge appropriate  CRANIAL NERVE:   2nd - no papilledema on fundoscopic exam  2nd, 3rd, 4th, 6th - pupils equal and reactive to light, visual fields full to confrontation, extraocular muscles intact, no nystagmus  5th - facial sensation symmetric  7th - facial strength symmetric  8th - hearing intact  9th - palate elevates symmetrically, uvula midline  11th - shoulder shrug symmetric  12th - tongue protrusion midline  MOTOR:   normal bulk and tone, full strength in the BUE, BLE  SENSORY:   normal and symmetric to light touch, temperature, vibration, pinprick  COORDINATION:   finger-nose-finger, fine finger movements normal  REFLEXES:   deep tendon reflexes TRACE and symmetric  GAIT/STATION:   narrow based gait     DIAGNOSTIC DATA (LABS, IMAGING, TESTING) - I reviewed patient records, labs, notes, testing and imaging myself where available.  Lab Results  Component Value Date   WBC 5.4 02/18/2017   HGB 12.9 02/18/2017   HCT 39.1 02/18/2017   MCV 79.5 (L) 02/18/2017   PLT 340 02/18/2017      Component Value Date/Time   NA 141 07/01/2017 1145   K 4.4 07/01/2017 1145   CL 104 07/01/2017 1145   CO2 28 07/01/2017 1145   GLUCOSE 126 07/01/2017 1145   BUN 24 07/01/2017 1145   CREATININE 1.09 (H) 07/01/2017 1145   CALCIUM 10.4 07/01/2017 1145   PROT 7.7 09/15/2017 1055   AST 21 09/15/2017 1055   ALT 19 09/15/2017 1055   BILITOT 0.3 09/15/2017 1055   GFRNONAA 58 (L) 07/01/2017 1145   GFRAA 68 07/01/2017 1145   Lab Results  Component Value Date   CHOL 180 09/15/2017   HDL 39 (L) 09/15/2017  LDLCALC 112 (H) 09/15/2017   TRIG 169 (H) 09/15/2017   CHOLHDL 4.6 09/15/2017   Lab Results  Component Value Date   HGBA1C 7.2 (H) 07/01/2017   Lab Results  Component Value Date   VITAMINB12 413 09/15/2017   Lab Results  Component Value Date   TSH 1.26 02/18/2017       ASSESSMENT AND PLAN  52 y.o. year old female here with migratory  numbness, tingling, needles sensation in arms and legs since 2013, when patient was also diagnosed with diabetes.  This is likely related to complication of diabetic neuropathy.   Dx: migratory paresthesias / acroparesthesias (likely related to diabetic neuropathy)  1. Diabetic polyneuropathy associated with type 2 diabetes mellitus (HCC)      PLAN:  - optimize diabetes control - consider multi-vitamin and alpha-lipoic acid supplements - consider gabapentin (for pain control)  Return if symptoms worsen or fail to improve, for return to PCP.    Suanne MarkerVIKRAM R. Bennett Ram, MD 11/16/2017, 8:45 AM Certified in Neurology, Neurophysiology and Neuroimaging  [facility] [address], [address] [place], [place] [address] [phone]

## 2017-11-18 DIAGNOSIS — I10 Essential (primary) hypertension: Secondary | ICD-10-CM | POA: Diagnosis not present

## 2017-11-18 DIAGNOSIS — Z7984 Long term (current) use of oral hypoglycemic drugs: Secondary | ICD-10-CM | POA: Diagnosis not present

## 2017-11-18 DIAGNOSIS — E785 Hyperlipidemia, unspecified: Secondary | ICD-10-CM | POA: Diagnosis not present

## 2017-11-18 DIAGNOSIS — E1140 Type 2 diabetes mellitus with diabetic neuropathy, unspecified: Secondary | ICD-10-CM | POA: Diagnosis not present

## 2017-11-21 ENCOUNTER — Telehealth: Payer: Self-pay | Admitting: Cardiology

## 2017-11-21 DIAGNOSIS — R002 Palpitations: Secondary | ICD-10-CM

## 2017-11-21 NOTE — Telephone Encounter (Signed)
Patient contacted to get serial number of monitor she wore and enrolled in [employer] site.

## 2017-11-21 NOTE — Telephone Encounter (Signed)
She has been wearing a monitor and she is having issues returning it    [place]Said the company told her they have no record of her being enrolled.

## 2017-11-21 NOTE — Progress Notes (Unsigned)
ERRONEOUS ENCOUNTER

## 2017-11-29 ENCOUNTER — Encounter: Payer: Self-pay | Admitting: Cardiology

## 2017-11-29 ENCOUNTER — Ambulatory Visit (INDEPENDENT_AMBULATORY_CARE_PROVIDER_SITE_OTHER): Payer: Medicare Other | Admitting: Cardiology

## 2017-11-29 VITALS — BP 122/70 | HR 90 | Ht 65.0 in | Wt 191.0 lb

## 2017-11-29 DIAGNOSIS — I493 Ventricular premature depolarization: Secondary | ICD-10-CM

## 2017-11-29 DIAGNOSIS — Z720 Tobacco use: Secondary | ICD-10-CM | POA: Diagnosis not present

## 2017-11-29 MED ORDER — FLECAINIDE ACETATE 50 MG PO TABS: 50.0000 mg | ORAL_TABLET | Freq: Two times a day (BID) | ORAL | 3 refills | Status: DC

## 2017-11-29 NOTE — Patient Instructions (Signed)
Medication Instructions:  Your physician has recommended you make the following change in your medication:  1. START Flecainide 50 mg twice daily  * If you need a refill on your cardiac medications before your next appointment, please call your pharmacy.   Labwork: None ordered  Testing/Procedures: None ordered  Follow-Up: Your physician recommends that you schedule a follow-up appointment in: 3 months with Dr. Elberta Fortisamnitz.   Thank you for choosing [facility]!!   Dory HornSherri Brennon Otterness, RN [phone]  Any Other Special Instructions Will Be Listed Below (If Applicable).  Flecainide tablets What is this medicine? FLECAINIDE (FLEK a nide) is an antiarrhythmic drug. This medicine is used to prevent irregular heart rhythm. It can also slow down fast heartbeats called tachycardia. This medicine may be used for other purposes; ask your health care provider or pharmacist if you have questions. COMMON BRAND NAME(S): Tambocor What should I tell my health care provider before I take this medicine? They need to know if you have any of these conditions: -abnormal levels of potassium in the blood -heart disease including heart rhythm and heart rate problems -kidney or liver disease -recent heart attack -an unusual or allergic reaction to flecainide, local anesthetics, other medicines, foods, dyes, or preservatives -pregnant or trying to get pregnant -breast-feeding How should I use this medicine? Take this medicine by mouth with a glass of water. Follow the directions on the prescription label. You can take this medicine with or without food. Take your doses at regular intervals. Do not take your medicine more often than directed. Do not stop taking this medicine suddenly. This may cause serious, heart-related side effects. If your doctor wants you to stop the medicine, the dose may be slowly lowered over time to avoid any side effects. Talk to your pediatrician regarding the use of this medicine  in children. While this drug may be prescribed for children as young as 1 year of age for selected conditions, precautions do apply. Overdosage: If you think you have taken too much of this medicine contact a poison control center or emergency room at once. NOTE: This medicine is only for you. Do not share this medicine with others. What if I miss a dose? If you miss a dose, take it as soon as you can. If it is almost time for your next dose, take only that dose. Do not take double or extra doses. What may interact with this medicine? Do not take this medicine with any of the following medications: -amoxapine -arsenic trioxide -certain antibiotics like clarithromycin, erythromycin, gatifloxacin, gemifloxacin, levofloxacin, moxifloxacin, sparfloxacin, or troleandomycin -certain antidepressants called tricyclic antidepressants like amitriptyline, imipramine, or nortriptyline -certain medicines to control heart rhythm like disopyramide, dofetilide, encainide, moricizine, procainamide, propafenone, and quinidine -cisapride -cyclobenzaprine -delavirdine -droperidol -haloperidol -hawthorn -imatinib -levomethadyl -maprotiline -medicines for malaria like chloroquine and halofantrine -pentamidine -phenothiazines like chlorpromazine, mesoridazine, prochlorperazine, thioridazine -pimozide -quinine -ranolazine -ritonavir -sertindole -ziprasidone This medicine may also interact with the following medications: -cimetidine -medicines for angina or high blood pressure -medicines to control heart rhythm like amiodarone and digoxin This list may not describe all possible interactions. Give your health care provider a list of all the medicines, herbs, non-prescription drugs, or dietary supplements you use. Also tell them if you smoke, drink alcohol, or use illegal drugs. Some items may interact with your medicine. What should I watch for while using this medicine? Visit your doctor or health care  professional for regular checks on your progress. Because your condition and the use of this medicine  carries some risk, it is a good idea to carry an identification card, necklace or bracelet with details of your condition, medications and doctor or health care professional. Check your blood pressure and pulse rate regularly. Ask your health care professional what your blood pressure and pulse rate should be, and when you should contact him or her. Your doctor or health care professional also may schedule regular blood tests and electrocardiograms to check your progress. You may get drowsy or dizzy. Do not drive, use machinery, or do anything that needs mental alertness until you know how this medicine affects you. Do not stand or sit up quickly, especially if you are an older patient. This reduces the risk of dizzy or fainting spells. Alcohol can make you more dizzy, increase flushing and rapid heartbeats. Avoid alcoholic drinks. What side effects may I notice from receiving this medicine? Side effects that you should report to your doctor or health care professional as soon as possible: -chest pain, continued irregular heartbeats -difficulty breathing -swelling of the legs or feet -trembling, shaking -unusually weak or tired Side effects that usually do not require medical attention (report to your doctor or health care professional if they continue or are bothersome): -blurred vision -constipation -headache -nausea, vomiting -stomach pain This list may not describe all possible side effects. Call your doctor for medical advice about side effects. You may report side effects to FDA at 1-800-FDA-1088. Where should I keep my medicine? Keep out of the reach of children. Store at room temperature between 15 and 30 degrees C (59 and 86 degrees F). Protect from light. Keep container tightly closed. Throw away any unused medicine after the expiration date. NOTE: This sheet is a summary. It may not  cover all possible information. If you have questions about this medicine, talk to your doctor, pharmacist, or health care provider.  2018 Elsevier/Gold Standard (2007-04-26 16:46:09)

## 2017-11-29 NOTE — Progress Notes (Signed)
Electrophysiology Office Note   Date:  11/29/2017   ID:  Tasha Aguilar, DOB August 22, 1965, MRN [id number]  PCP:  Aliene Beams, MD  Cardiologist:   Primary Electrophysiologist:  Avonda Toso Jorja Loa, MD    No chief complaint on file.    History of Present Illness: Tasha Aguilar is a 52 y.o. female who is being seen today for the evaluation of palpitations at the request of Aliene Beams, MD. Presenting today for electrophysiology evaluation.  She has a history of diabetes, hypertension, hyperlipidemia.  Today, denies symptoms of chest pain, shortness of breath, orthopnea, PND, lower extremity edema, claudication, dizziness, presyncope, syncope, bleeding, or neurologic sequela. The patient is tolerating medications without difficulties.  Overall she is continued to have similar symptoms.  She did wear a cardiac monitor that showed episodes of ventricular runs, up to 4 beats long.  She does have symptoms continued of palpitations.  No exacerbating or alleviating symptoms.  Her palpitations are unchanged since she was last seen.  At times they feel like her heart is beating much stronger than it should be.  Past Medical History:  Diagnosis Date  . Abdominal pain 07/27/2017  . AMI (acute myocardial infarction) (HCC) 2006   "mild"  . Bronchitis 02/19/2017  . Diabetes mellitus without complication (HCC)   . Essential hypertension 02/19/2017  . Fatigue 02/19/2017  . GERD (gastroesophageal reflux disease) 05/02/2017  . Hyperlipidemia   . Hyperlipidemia LDL goal <70 02/19/2017  . Hypertension   . IBS (irritable bowel syndrome) 07/27/2017  . Ingrown toenail 04/27/2017  . Irritable bowel syndrome with constipation 02/19/2017  . Obesity (BMI 30.0-34.9) 04/27/2017  . Primary osteoarthritis of right knee 04/27/2017  . Tobacco abuse counseling 04/27/2017  . Type 2 diabetes mellitus with complication, without long-term current use of insulin (HCC) 02/19/2017  . Vitamin D deficiency 02/19/2017   Past  Surgical History:  Procedure Laterality Date  . ABDOMINAL HYSTERECTOMY     due to menorrhagia  . APPENDECTOMY    . CESAREAN SECTION     x 4  . CHOLECYSTECTOMY    . JOINT REPLACEMENT     total left knee  . LUNG BIOPSY       Current Outpatient Medications  Medication Sig Dispense Refill  . albuterol (PROAIR HFA) 108 (90 Base) MCG/ACT inhaler Inhale into the lungs every 6 (six) hours as needed for wheezing or shortness of breath.    Marland Kitchen aluminum chloride (DRYSOL) 20 % external solution Apply topically at bedtime. 35 mL 0  . amLODipine (NORVASC) 10 MG tablet Take 1 tablet (10 mg total) by mouth daily. 90 tablet 1  . atorvastatin (LIPITOR) 80 MG tablet Take 1 tablet (80 mg total) by mouth daily. 90 tablet 0  . CHANTIX CONTINUING MONTH PAK 1 MG tablet TAKE 1 TABLET BY MOUTH TWICE A DAY 60 tablet 1  . fenofibrate (TRICOR) 145 MG tablet TAKE 1 TABLET BY MOUTH EVERY DAY 30 tablet 2  . glipiZIDE (GLIPIZIDE XL) 10 MG 24 hr tablet Take 1 tablet (10 mg total) by mouth daily with breakfast. 90 tablet 1  . lisinopril (PRINIVIL,ZESTRIL) 40 MG tablet Take 1 tablet (40 mg total) by mouth daily. 90 tablet 3  . loperamide (IMODIUM A-D) 2 MG tablet Take 2 mg by mouth as needed for diarrhea or loose stools.    . metFORMIN (GLUCOPHAGE) 1000 MG tablet Take 1 tablet (1,000 mg total) by mouth 2 (two) times daily. 60 tablet 3  . metoprolol succinate (TOPROL-XL) 50 MG 24  hr tablet Take 1 tablet (50 mg total) by mouth daily. Take with or immediately following a meal. 90 tablet 3  . montelukast (SINGULAIR) 10 MG tablet Take 1 tablet (10 mg total) by mouth at bedtime. 90 tablet 1  . omeprazole (PRILOSEC) 40 MG capsule TAKE 1 CAPSULE BY MOUTH EVERY DAY 30 capsule 5  . triamterene-hydrochlorothiazide (MAXZIDE-25) 37.5-25 MG tablet Take 1 tablet by mouth daily. 90 tablet 3  . flecainide (TAMBOCOR) 50 MG tablet Take 1 tablet (50 mg total) by mouth 2 (two) times daily. 60 tablet 3   No current facility-administered  medications for this visit.     Allergies:   Latex   Social History:  The patient  reports that she has been smoking cigarettes. She has a 8.50 pack-year smoking history. She has never used smokeless tobacco. She reports that she does not drink alcohol or use drugs.   Family History:  The patient's family history includes Cancer in her brother; Diabetes in her brother, brother, father, and mother; Glaucoma in her father and mother; Heart attack in her mother; Heart disease in her father; Hypertension in her brother, brother, father, and mother; Kidney disease in her brother; Throat cancer in her father.    ROS:  Please see the history of present illness.   Otherwise, review of systems is positive for palpitations.   All other systems are reviewed and negative.   PHYSICAL EXAM: VS:  BP 122/70   Pulse 90   Ht 5\' 5"  (1.651 m)   Wt 191 lb (86.6 kg)   SpO2 98%   BMI 31.78 kg/m  , BMI Body mass index is 31.78 kg/m. GEN: Well nourished, well developed, in no acute distress  HEENT: normal  Neck: no JVD, carotid bruits, or masses Cardiac: RRR; no murmurs, rubs, or gallops,no edema  Respiratory:  clear to auscultation bilaterally, normal work of breathing GI: soft, nontender, nondistended, + BS MS: no deformity or atrophy  Skin: warm and dry Neuro:  Strength and sensation are intact Psych: euthymic mood, full affect  EKG:  EKG is not ordered today. Personal review of the ekg ordered 11/01/17 shows SR, LAD, rate 79   Recent Labs: 02/18/2017: Hemoglobin 12.9; Platelets 340; TSH 1.26 07/01/2017: BUN 24; Creat 1.09; Potassium 4.4; Sodium 141 09/15/2017: ALT 19    Lipid Panel     Component Value Date/Time   CHOL 180 09/15/2017 1055   TRIG 169 (H) 09/15/2017 1055   HDL 39 (L) 09/15/2017 1055   CHOLHDL 4.6 09/15/2017 1055   LDLCALC 112 (H) 09/15/2017 1055     Wt Readings from Last 3 Encounters:  11/29/17 191 lb (86.6 kg)  11/16/17 189 lb 12.8 oz (86.1 kg)  11/01/17 188 lb (85.3 kg)       Other studies Reviewed: Additional studies/ records that were reviewed today include: Holter 11/29/17 - personally reviewed Minimum: 57 bpm  Maximum: 129 bpm  Average: 73 bpm Rare ventricular and supraventricular ectopy 0% atrial fibrillation burden Intermittent couplets and ventricular runs, maximum 3 beats  ASSESSMENT AND PLAN:  1.  PVCs: It appears that her symptoms are due to short runs of PVCs.  She is having less than 1% PVCs, though she does appear to be quite symptomatic.  We Ivery Michalski thus start her on flecainide 50 mg twice a day.  She has had both echoes and stress [place]  We Kordelia Severin work to get those records.  2.  Tobacco abuse: Down to 5 cigarettes a  day.  She is not bought a new pack in the last week and a half.  5 minutes were spent on this discussion.    Current medicines are reviewed at length with the patient today.   The patient does not have concerns regarding her medicines.  The following changes were made today:  flecainide  Labs/ tests ordered today include:  No orders of the defined types were placed in this encounter.    Disposition:   FU with Deedee Lybarger 3 months  Signed, Mirah Nevins Jorja Loa, MD  11/29/2017 1:59 PM     [facility] [address] [place] [address] [phone] (office) [phone] (fax)

## 2017-12-05 ENCOUNTER — Other Ambulatory Visit: Payer: Self-pay

## 2017-12-05 NOTE — Patient Outreach (Signed)
[facility] Care Management  12/05/2017  Serena CroissantKlatoya A Shein [birth year] [id number]   Medication Adherence call to Mrs. Vance PeperKlatoya Dionisio spoke with patient she is due on Lisinopril 40 mg patient explain she is getting a pill pack [facility] and she prefers to do it this way that way she remembers when to take the medications. [name] is showing past due under [facility].   Lillia AbedAna Ollison-Moran CPhT Pharmacy Technician [facility] Care Management Direct Dial [phone]  Fax [phone] [email or web address]

## 2017-12-19 ENCOUNTER — Ambulatory Visit: Payer: Medicare Other | Admitting: Nurse Practitioner

## 2018-01-31 ENCOUNTER — Other Ambulatory Visit: Payer: Self-pay | Admitting: Family Medicine

## 2018-01-31 DIAGNOSIS — Z720 Tobacco use: Secondary | ICD-10-CM

## 2018-02-20 ENCOUNTER — Encounter: Payer: Self-pay | Admitting: *Deleted

## 2018-02-27 DIAGNOSIS — E1140 Type 2 diabetes mellitus with diabetic neuropathy, unspecified: Secondary | ICD-10-CM | POA: Diagnosis not present

## 2018-02-27 DIAGNOSIS — E785 Hyperlipidemia, unspecified: Secondary | ICD-10-CM | POA: Diagnosis not present

## 2018-02-27 DIAGNOSIS — I10 Essential (primary) hypertension: Secondary | ICD-10-CM | POA: Diagnosis not present

## 2018-02-27 DIAGNOSIS — Z720 Tobacco use: Secondary | ICD-10-CM | POA: Diagnosis not present

## 2018-02-28 ENCOUNTER — Ambulatory Visit (INDEPENDENT_AMBULATORY_CARE_PROVIDER_SITE_OTHER): Payer: Medicare Other | Admitting: Cardiology

## 2018-02-28 ENCOUNTER — Encounter: Payer: Self-pay | Admitting: Cardiology

## 2018-02-28 VITALS — BP 126/64 | HR 68 | Ht 65.0 in | Wt 184.0 lb

## 2018-02-28 DIAGNOSIS — I493 Ventricular premature depolarization: Secondary | ICD-10-CM

## 2018-02-28 DIAGNOSIS — Z720 Tobacco use: Secondary | ICD-10-CM | POA: Diagnosis not present

## 2018-02-28 MED ORDER — FLECAINIDE ACETATE 100 MG PO TABS: 100.0000 mg | ORAL_TABLET | Freq: Two times a day (BID) | ORAL | 1 refills | Status: DC

## 2018-02-28 NOTE — Patient Instructions (Addendum)
Medication Instructions:  Your physician has recommended you make the following change in your medication: 1. INCREASE Flecainide to 100 mg twice daily  * If you need a refill on your cardiac medications before your next appointment, please call your pharmacy.   Labwork: None ordered  Testing/Procedures: None ordered  Follow-Up: Your physician recommends that you schedule a follow-up appointment in: 7-10 days for nurse visit EKG.  Your physician recommends that you schedule a follow-up appointment in: 3 months with Dr. Elberta Fortis.   Thank you for choosing [facility]!!   Dory Horn, RN [phone]

## 2018-02-28 NOTE — Progress Notes (Signed)
Electrophysiology Office Note   Date:  02/28/2018   ID:  Tasha Aguilar, DOB 1965/07/22, MRN [id number]  PCP:  Tasha Beams, MD  Cardiologist:   Primary Electrophysiologist:  Tasha Conradt Jorja Loa, MD    No chief complaint on file.    History of Present Illness: Tasha Aguilar is a 53 y.o. female who is being seen today for the evaluation of palpitations at the request of Tasha Beams, MD. Presenting today for electrophysiology evaluation.  She has a history of diabetes, hypertension, hyperlipidemia.  She has a history of rare PVCs, though quite symptomatic and was put on flecainide.  Today, denies symptoms of chest pain, shortness of breath, orthopnea, PND, lower extremity edema, claudication, dizziness, presyncope, syncope, bleeding, or neurologic sequela. The patient is tolerating medications without difficulties.  Unfortunately, she has continued to have palpitations.  She has noted no chest pain.  She says the flecainide has not really changed her symptoms much.  She is worried that she [name] have a heart attack, but she was reassured that she is not at risk.  Past Medical History:  Diagnosis Date  . Abdominal pain 07/27/2017  . AMI (acute myocardial infarction) (HCC) 2006   "mild"  . Bronchitis 02/19/2017  . Diabetes mellitus without complication (HCC)   . Essential hypertension 02/19/2017  . Fatigue 02/19/2017  . GERD (gastroesophageal reflux disease) 05/02/2017  . Hyperlipidemia   . Hyperlipidemia LDL goal <70 02/19/2017  . Hypertension   . IBS (irritable bowel syndrome) 07/27/2017  . Ingrown toenail 04/27/2017  . Irritable bowel syndrome with constipation 02/19/2017  . Obesity (BMI 30.0-34.9) 04/27/2017  . Primary osteoarthritis of right knee 04/27/2017  . Tobacco abuse counseling 04/27/2017  . Type 2 diabetes mellitus with complication, without long-term current use of insulin (HCC) 02/19/2017  . Vitamin D deficiency 02/19/2017   Past Surgical History:  Procedure Laterality  Date  . ABDOMINAL HYSTERECTOMY     due to menorrhagia  . APPENDECTOMY    . CESAREAN SECTION     x 4  . CHOLECYSTECTOMY    . JOINT REPLACEMENT     total left knee  . LUNG BIOPSY       Current Outpatient Medications  Medication Sig Dispense Refill  . albuterol (PROAIR HFA) 108 (90 Base) MCG/ACT inhaler Inhale into the lungs every 6 (six) hours as needed for wheezing or shortness of breath.    Marland Kitchen aluminum chloride (DRYSOL) 20 % external solution Apply topically at bedtime. 35 mL 0  . amLODipine (NORVASC) 10 MG tablet Take 1 tablet (10 mg total) by mouth daily. 90 tablet 1  . atorvastatin (LIPITOR) 80 MG tablet Take 1 tablet (80 mg total) by mouth daily. 90 tablet 0  . buPROPion (WELLBUTRIN XL) 300 MG 24 hr tablet Take 300 mg by mouth daily.    . CHANTIX CONTINUING MONTH PAK 1 MG tablet TAKE 1 TABLET BY MOUTH TWICE A DAY 60 tablet 1  . [facility] ASPIRIN ADULT LOW DOSE 81 MG chewable tablet Chew 81 mg by mouth daily.    . fenofibrate (TRICOR) 145 MG tablet TAKE 1 TABLET BY MOUTH EVERY DAY 30 tablet 2  . glipiZIDE (GLIPIZIDE XL) 10 MG 24 hr tablet Take 1 tablet (10 mg total) by mouth daily with breakfast. 90 tablet 1  . lisinopril (PRINIVIL,ZESTRIL) 40 MG tablet Take 1 tablet (40 mg total) by mouth daily. 90 tablet 3  . loperamide (IMODIUM A-D) 2 MG tablet Take 2 mg by mouth as needed for diarrhea or  loose stools.    . metFORMIN (GLUCOPHAGE) 1000 MG tablet Take 1 tablet (1,000 mg total) by mouth 2 (two) times daily. 60 tablet 3  . metoprolol succinate (TOPROL-XL) 50 MG 24 hr tablet Take 1 tablet (50 mg total) by mouth daily. Take with or immediately following a meal. 90 tablet 3  . montelukast (SINGULAIR) 10 MG tablet Take 1 tablet (10 mg total) by mouth at bedtime. 90 tablet 1  . omeprazole (PRILOSEC) 40 MG capsule TAKE 1 CAPSULE BY MOUTH EVERY DAY 30 capsule 5  . pregabalin (LYRICA) 50 MG capsule Take 1 capsule by mouth 3 (three) times daily.    Marland Kitchen triamterene-hydrochlorothiazide (MAXZIDE-25)  37.5-25 MG tablet Take 1 tablet by mouth daily. 90 tablet 3  . Vitamin D, Ergocalciferol, (DRISDOL) 1.25 MG (50000 UT) CAPS capsule Take 1 capsule by mouth once a week.     No current facility-administered medications for this visit.     Allergies:   Latex   Social History:  The patient  reports that she has been smoking cigarettes. She has a 8.50 pack-year smoking history. She has never used smokeless tobacco. She reports that she does not drink alcohol or use drugs.   Family History:  The patient's family history includes Cancer in her brother; Diabetes in her brother, brother, father, and mother; Glaucoma in her father and mother; Heart attack in her mother; Heart disease in her father; Hypertension in her brother, brother, father, and mother; Kidney disease in her brother; Throat cancer in her father.    ROS:  Please see the history of present illness.   Otherwise, review of systems is positive for palpitations.   All other systems are reviewed and negative.   PHYSICAL EXAM: VS:  BP 126/64   Pulse 68   Ht  (1.651 m)   Wt 184 lb (83.5 kg)   BMI 30.62 kg/m  , BMI Body mass index is 30.62 kg/m. GEN: Well nourished, well developed, in no acute distress  HEENT: normal  Neck: no JVD, carotid bruits, or masses Cardiac: RRR; no murmurs, rubs, or gallops,no edema  Respiratory:  clear to auscultation bilaterally, normal work of breathing GI: soft, nontender, nondistended, + BS MS: no deformity or atrophy  Skin: warm and dry Neuro:  Strength and sensation are intact Psych: euthymic mood, full affect  EKG:  EKG is ordered today. Personal review of the ekg ordered shows sinus rhythm, rate 68  Recent Labs: 07/01/2017: BUN 24; Creat 1.09; Potassium 4.4; Sodium 141 09/15/2017: ALT 19    Lipid Panel     Component Value Date/Time   CHOL 180 09/15/2017 1055   TRIG 169 (H) 09/15/2017 1055   HDL 39 (L) 09/15/2017 1055   CHOLHDL 4.6 09/15/2017 1055   LDLCALC 112 (H) 09/15/2017 1055       Wt Readings from Last 3 Encounters:  02/28/18 184 lb (83.5 kg)  11/29/17 191 lb (86.6 kg)  11/16/17 189 lb 12.8 oz (86.1 kg)      Other studies Reviewed: Additional studies/ records that were reviewed today include: Holter 11/29/17 - personally reviewed Minimum: 57 bpm  Maximum: 129 bpm  Average: 73 bpm Rare ventricular and supraventricular ectopy 0% atrial fibrillation burden Intermittent couplets and ventricular runs, maximum 3 beats  TTE 2017 Normal LV size and function with mild LVH, ejection fraction 65 to 70%  ASSESSMENT AND PLAN:  1.  PVCs: Having less than 1% PVCs noted on cardiac monitor.  Despite that she does continue to have symptoms.  I reassured her that her PVCs are not life-threatening.  She is on flecainide 50 mg and Neil Brickell increase it to 100.    2.  Tobacco abuse: Cessation encouraged    Current medicines are reviewed at length with the patient today.   The patient does not have concerns regarding her medicines.  The following changes were made today: Increase flecainide  Labs/ tests ordered today include:  Orders Placed This Encounter  Procedures  . EKG 12-Lead     Disposition:   FU with Ferry Matthis 3 months  Signed, Dhanvi Boesen Jorja Loa, MD  02/28/2018 8:39 AM     [facility] [address] [place] [address] [phone] (office) [phone] (fax)

## 2018-03-07 ENCOUNTER — Telehealth: Payer: Self-pay | Admitting: Nurse Practitioner

## 2018-03-07 ENCOUNTER — Encounter: Payer: Self-pay | Admitting: Internal Medicine

## 2018-03-07 ENCOUNTER — Ambulatory Visit: Payer: Medicare Other | Admitting: Nurse Practitioner

## 2018-03-07 NOTE — Telephone Encounter (Signed)
PATIENT WAS A NO SHOW AND LETTER SENT  °

## 2018-03-07 NOTE — Progress Notes (Deleted)
Referring Provider: Aliene Beams, MD Primary Care Physician:  Aliene Beams, MD Primary GI:  Dr. Jena Gauss  No chief complaint on file.   HPI:   Tasha Aguilar is a 53 y.o. female who presents for follow-up on IBS and GERD.  The patient was last seen in our office 07/27/2017 for the same as well as lower abdominal pain.  Noted history of chronic IBS-D.  Viberzi worked well previously but had to stop due to status post cholecystectomy.  Colonoscopy up-to-date 03/30/2013 as well as EGD.  Recommendations for acid reflux and Imodium as needed for diarrhea.  At her last visit GERD was improved on PPI, Bentyl caused worse diarrhea and stopped taking it.  Imodium helps and limits her to 1 bowel movement a day versus 3-4 bowel movements a day if she does not take it.  Lower abdominal pain improves a bowel movement.  Chronic metformin for 5 to 6 years, noted diarrhea and IBS "forever".  Recommended trial of Lomotil 4 times a day as needed, continue omeprazole, progress report in 2 weeks, follow-up in 3 months.  The patient did not call a progress report as requested.  Today she states   Past Medical History:  Diagnosis Date  . Abdominal pain 07/27/2017  . AMI (acute myocardial infarction) (HCC) 2006   "mild"  . Bronchitis 02/19/2017  . Diabetes mellitus without complication (HCC)   . Essential hypertension 02/19/2017  . Fatigue 02/19/2017  . GERD (gastroesophageal reflux disease) 05/02/2017  . Hyperlipidemia   . Hyperlipidemia LDL goal <70 02/19/2017  . Hypertension   . IBS (irritable bowel syndrome) 07/27/2017  . Ingrown toenail 04/27/2017  . Irritable bowel syndrome with constipation 02/19/2017  . Obesity (BMI 30.0-34.9) 04/27/2017  . Primary osteoarthritis of right knee 04/27/2017  . Tobacco abuse counseling 04/27/2017  . Type 2 diabetes mellitus with complication, without long-term current use of insulin (HCC) 02/19/2017  . Vitamin D deficiency 02/19/2017    Past Surgical History:  Procedure  Laterality Date  . ABDOMINAL HYSTERECTOMY     due to menorrhagia  . APPENDECTOMY    . CESAREAN SECTION     x 4  . CHOLECYSTECTOMY    . JOINT REPLACEMENT     total left knee  . LUNG BIOPSY      Current Outpatient Medications  Medication Sig Dispense Refill  . albuterol (PROAIR HFA) 108 (90 Base) MCG/ACT inhaler Inhale into the lungs every 6 (six) hours as needed for wheezing or shortness of breath.    Marland Kitchen aluminum chloride (DRYSOL) 20 % external solution Apply topically at bedtime. 35 mL 0  . amLODipine (NORVASC) 10 MG tablet Take 1 tablet (10 mg total) by mouth daily. 90 tablet 1  . atorvastatin (LIPITOR) 80 MG tablet Take 1 tablet (80 mg total) by mouth daily. 90 tablet 0  . buPROPion (WELLBUTRIN XL) 300 MG 24 hr tablet Take 300 mg by mouth daily.    . CHANTIX CONTINUING MONTH PAK 1 MG tablet TAKE 1 TABLET BY MOUTH TWICE A DAY 60 tablet 1  . [facility] ASPIRIN ADULT LOW DOSE 81 MG chewable tablet Chew 81 mg by mouth daily.    . fenofibrate (TRICOR) 145 MG tablet TAKE 1 TABLET BY MOUTH EVERY DAY 30 tablet 2  . flecainide (TAMBOCOR) 100 MG tablet Take 1 tablet (100 mg total) by mouth 2 (two) times daily. 180 tablet 1  . glipiZIDE (GLIPIZIDE XL) 10 MG 24 hr tablet Take 1 tablet (10 mg total) by mouth daily  with breakfast. 90 tablet 1  . lisinopril (PRINIVIL,ZESTRIL) 40 MG tablet Take 1 tablet (40 mg total) by mouth daily. 90 tablet 3  . loperamide (IMODIUM A-D) 2 MG tablet Take 2 mg by mouth as needed for diarrhea or loose stools.    . metFORMIN (GLUCOPHAGE) 1000 MG tablet Take 1 tablet (1,000 mg total) by mouth 2 (two) times daily. 60 tablet 3  . metoprolol succinate (TOPROL-XL) 50 MG 24 hr tablet Take 1 tablet (50 mg total) by mouth daily. Take with or immediately following a meal. 90 tablet 3  . montelukast (SINGULAIR) 10 MG tablet Take 1 tablet (10 mg total) by mouth at bedtime. 90 tablet 1  . omeprazole (PRILOSEC) 40 MG capsule TAKE 1 CAPSULE BY MOUTH EVERY DAY 30 capsule 5  . pregabalin  (LYRICA) 50 MG capsule Take 1 capsule by mouth 3 (three) times daily.    Marland Kitchen triamterene-hydrochlorothiazide (MAXZIDE-25) 37.5-25 MG tablet Take 1 tablet by mouth daily. 90 tablet 3  . Vitamin D, Ergocalciferol, (DRISDOL) 1.25 MG (50000 UT) CAPS capsule Take 1 capsule by mouth once a week.     No current facility-administered medications for this visit.     Allergies as of 03/07/2018 - Review Complete 02/28/2018  Allergen Reaction Noted  . Latex Other (See Comments) 02/18/2017    Family History  Problem Relation Age of Onset  . Diabetes Mother   . Glaucoma Mother   . Hypertension Mother   . Heart attack Mother   . Hypertension Father   . Diabetes Father   . Heart disease Father   . Glaucoma Father   . Throat cancer Father   . Kidney disease Brother   . Diabetes Brother   . Hypertension Brother   . Hypertension Brother   . Diabetes Brother   . Cancer Brother        angiosarcoma  . Colon cancer Neg Hx   . Gastric cancer Neg Hx   . Esophageal cancer Neg Hx     Social History   Socioeconomic History  . Marital status: Single    Spouse name: Not on file  . Number of children: 4  . Years of education: 30  . Highest education level: 12th grade  Occupational History  . Not on file  Social Needs  . Financial resource strain: Not on file  . Food insecurity:    Worry: Not on file    Inability: Not on file  . Transportation needs:    Medical: Not on file    Non-medical: Not on file  Tobacco Use  . Smoking status: Current Every Day Smoker    Packs/day: 0.25    Years: 34.00    Pack years: 8.50    Types: Cigarettes  . Smokeless tobacco: Never Used  Substance and Sexual Activity  . Alcohol use: No    Frequency: Never  . Drug use: No  . Sexual activity: Yes    Birth control/protection: Surgical  Lifestyle  . Physical activity:    Days per week: Not on file    Minutes per session: Not on file  . Stress: Not on file  Relationships  . Social connections:    Talks on  phone: Not on file    Gets together: Not on file    Attends religious service: Not on file    Active member of club or organization: Not on file    Attends meetings of clubs or organizations: Not on file    Relationship status: Not on  file  Other Topics Concern  . Not on file  Social History Narrative   Lives in [place], single but lives with man, Tommi Emery and son.  Has children, 4. Grew up in [place] and moved to [place] in 2017. Moved to [place] in 2018.    Enjoys movies, tv.    Eats all food groups.   Wear seat belt.   Drives.    Does not go to church.    Caffeine one cup 4 x week    Review of Systems: General: Negative for anorexia, weight loss, fever, chills, fatigue, weakness. Eyes: Negative for vision changes.  ENT: Negative for hoarseness, difficulty swallowing , nasal congestion. CV: Negative for chest pain, angina, palpitations, dyspnea on exertion, peripheral edema.  Respiratory: Negative for dyspnea at rest, dyspnea on exertion, cough, sputum, wheezing.  GI: See history of present illness. GU:  Negative for dysuria, hematuria, urinary incontinence, urinary frequency, nocturnal urination.  MS: Negative for joint pain, low back pain.  Derm: Negative for rash or itching.  Neuro: Negative for weakness, abnormal sensation, seizure, frequent headaches, memory loss, confusion.  Psych: Negative for anxiety, depression, suicidal ideation, hallucinations.  Endo: Negative for unusual weight change.  Heme: Negative for bruising or bleeding. Allergy: Negative for rash or hives.   Physical Exam: There were no vitals taken for this visit. General:   Alert and oriented. Pleasant and cooperative. Well-nourished and well-developed.  Head:  Normocephalic and atraumatic. Eyes:  Without icterus, sclera clear and conjunctiva pink.  Ears:  Normal auditory acuity. Mouth:  No deformity or lesions, oral mucosa pink.  Throat/Neck:  Supple, without mass or thyromegaly. Cardiovascular:   S1, S2 present without murmurs appreciated. Normal pulses noted. Extremities without clubbing or edema. Respiratory:  Clear to auscultation bilaterally. No wheezes, rales, or rhonchi. No distress.  Gastrointestinal:  +BS, soft, non-tender and non-distended. No HSM noted. No guarding or rebound. No masses appreciated.  Rectal:  Deferred  Musculoskalatal:  Symmetrical without gross deformities. Normal posture. Skin:  Intact without significant lesions or rashes. Neurologic:  Alert and oriented x4;  grossly normal neurologically. Psych:  Alert and cooperative. Normal mood and affect. Heme/Lymph/Immune: No significant cervical adenopathy. No excessive bruising noted.    03/07/2018 7:36 AM   Disclaimer: This note was dictated with voice recognition software. Similar sounding words can inadvertently be transcribed and may not be corrected upon review.

## 2018-03-09 ENCOUNTER — Ambulatory Visit: Payer: Medicare Other

## 2018-03-14 ENCOUNTER — Ambulatory Visit (INDEPENDENT_AMBULATORY_CARE_PROVIDER_SITE_OTHER): Payer: Self-pay | Admitting: Physical Medicine and Rehabilitation

## 2018-06-15 ENCOUNTER — Telehealth: Payer: Self-pay | Admitting: *Deleted

## 2018-06-15 NOTE — Telephone Encounter (Signed)
Patient currently is not comfortable with coming in to office but wants to keep this appointment as a virtual visit.     Virtual Visit Pre-Appointment Phone Call  "(Name), I am calling you today to discuss your upcoming appointment. We are currently trying to limit exposure to the virus that causes COVID-19 by seeing patients at home rather than in the office."  1. "What is the BEST phone number to call the day of the visit?" - include this in appointment notes  2. "Do you have or have access to (through a family member/friend) a smartphone with video capability that we can use for your visit?" a. If yes - list this number in appt notes as "cell" (if different from BEST phone #) and list the appointment type as a VIDEO visit in appointment notes b. If no - list the appointment type as a PHONE visit in appointment notes  3. Confirm consent - "In the setting of the current Covid19 crisis, you are scheduled for a (phone or video) visit with your provider on (date) at (time).  Just as we do with many in-office visits, in order for you to participate in this visit, we must obtain consent.  If you'd like, I can send this to your mychart (if signed up) or email for you to review.  Otherwise, I can obtain your verbal consent now.  All virtual visits are billed to your insurance company just like a normal visit would be.  By agreeing to a virtual visit, we'd like you to understand that the technology does not allow for your provider to perform an examination, and thus may limit your provider's ability to fully assess your condition. If your provider identifies any concerns that need to be evaluated in person, we will make arrangements to do so.  Finally, though the technology is pretty good, we cannot assure that it will always work on either your or our end, and in the setting of a video visit, we may have to convert it to a phone-only visit.  In either situation, we cannot ensure that we have a secure  connection.  Are you willing to proceed?" STAFF: Did the patient verbally acknowledge consent to telehealth visit? Document YES/NO here: YES  4. Advise patient to be prepared - "Two hours prior to your appointment, go ahead and check your blood pressure, pulse, oxygen saturation, and your weight (if you have the equipment to check those) and write them all down. When your visit starts, your provider will ask you for this information. If you have an Apple Watch or Kardia device, please plan to have heart rate information ready on the day of your appointment. Please have a pen and paper handy nearby the day of the visit as well."  5. Give patient instructions for MyChart download to smartphone OR [email or web address] as below if video visit (depending on what platform provider is using)  6. Inform patient they will receive a phone call 15 minutes prior to their appointment time (may be from unknown caller ID) so they should be prepared to answer    TELEPHONE CALL NOTE  Tasha Aguilar has been deemed a candidate for a follow-up tele-health visit to limit community exposure during the Covid-19 pandemic. I spoke with the patient via phone to ensure availability of phone/video source, confirm preferred email & phone number, and discuss instructions and expectations.  I reminded Tasha Aguilar to be prepared with any vital sign and/or heart rhythm information that could potentially be  obtained via home monitoring, at the time of her visit. I reminded Tasha Aguilar to expect a phone call prior to her visit.  Marquavius Scaife 06/15/2018 10:13 AM   INSTRUCTIONS FOR DOWNLOADING THE MYCHART APP TO SMARTPHONE  - The patient must first make sure to have activated MyChart and know their login information - If Apple, go to [employer] and type in MyChart in the search bar and download the app. If Android, ask patient to go to [employer] and type in [place] in the search bar and download the app. The app is free  but as with any other app downloads, their phone may require them to verify saved payment information or Apple/Android password.  - The patient will need to then log into the app with their MyChart username and password, and select [facility] as their healthcare provider to link the account. When it is time for your visit, go to the MyChart app, find appointments, and click Begin Video Visit. Be sure to Select Allow for your device to access the Microphone and Camera for your visit. You will then be connected, and your provider will be with you shortly.  **If they have any issues connecting, or need assistance please contact MyChart service desk [phone])**  **If using a computer, in order to ensure the best quality for their visit they will need to use either of the following Internet Browsers: [employer], or Google Chrome**  IF USING DOXIMITY or DOXY.ME - The patient will receive a link just prior to their visit by text.     FULL LENGTH CONSENT FOR TELE-HEALTH VISIT   I hereby voluntarily request, consent and authorize [facility] and its employed or contracted physicians, physician assistants, nurse practitioners or other licensed health care professionals (the Practitioner), to provide me with telemedicine health care services (the "Services") as deemed necessary by the treating Practitioner. I acknowledge and consent to receive the Services by the Practitioner via telemedicine. I understand that the telemedicine visit will involve communicating with the Practitioner through live audiovisual communication technology and the disclosure of certain medical information by electronic transmission. I acknowledge that I have been given the opportunity to request an in-person assessment or other available alternative prior to the telemedicine visit and am voluntarily participating in the telemedicine visit.  I understand that I have the right to withhold or withdraw my consent  to the use of telemedicine in the course of my care at any time, without affecting my right to future care or treatment, and that the Practitioner or I may terminate the telemedicine visit at any time. I understand that I have the right to inspect all information obtained and/or recorded in the course of the telemedicine visit and may receive copies of available information for a reasonable fee.  I understand that some of the potential risks of receiving the Services via telemedicine include:  Marland Kitchen Delay or interruption in medical evaluation due to technological equipment failure or disruption; . Information transmitted may not be sufficient (e.g. poor resolution of images) to allow for appropriate medical decision making by the Practitioner; and/or  . In rare instances, security protocols could fail, causing a breach of personal health information.  Furthermore, I acknowledge that it is my responsibility to provide information about my medical history, conditions and care that is complete and accurate to the best of my ability. I acknowledge that Practitioner's advice, recommendations, and/or decision may be based on factors not within their control, such as  incomplete or inaccurate data provided by me or distortions of diagnostic images or specimens that may result from electronic transmissions. I understand that the practice of medicine is not an exact science and that Practitioner makes no warranties or guarantees regarding treatment outcomes. I acknowledge that I will receive a copy of this consent concurrently upon execution via email to the email address I last provided but may also request a printed copy by calling the office of [facility].    I understand that my insurance will be billed for this visit.   I have read or had this consent read to me. . I understand the contents of this consent, which adequately explains the benefits and risks of the Services being provided via telemedicine.  . I  have been provided ample opportunity to ask questions regarding this consent and the Services and have had my questions answered to my satisfaction. . I give my informed consent for the services to be provided through the use of telemedicine in my medical care  By participating in this telemedicine visit I agree to the above.

## 2018-06-20 ENCOUNTER — Telehealth (INDEPENDENT_AMBULATORY_CARE_PROVIDER_SITE_OTHER): Payer: Medicare Other | Admitting: Cardiology

## 2018-06-20 ENCOUNTER — Other Ambulatory Visit: Payer: Self-pay

## 2018-06-20 DIAGNOSIS — I493 Ventricular premature depolarization: Secondary | ICD-10-CM

## 2018-06-20 NOTE — Progress Notes (Signed)
Electrophysiology TeleHealth Note   Due to national recommendations of social distancing due to COVID 19, an audio/video telehealth visit is felt to be most appropriate for this patient at this time.  See Epic message for the patient's consent to telehealth for [facility].   Date:  06/20/2018   ID:  Tasha CroissantKlatoya A Whitcomb, DOB Feb 17, 1965, MRN [id number]  Location: patient's home  Provider location: [address], [place]  Evaluation Performed: Follow-up visit  PCP:  Aliene BeamsHagler, Rachel, MD  Cardiologist:  Dajia Gunnels Jorja LoaMartin Corisa Montini, MD  Electrophysiologist:  Dr Elberta Fortisamnitz  Chief Complaint: PVCs  History of Present Illness:    Tasha Aguilar is a 53 y.o. female who presents via audio/video conferencing for a telehealth visit today.  Since last being seen in our clinic, the patient reports doing very well.  Today, she denies symptoms of palpitations, chest pain, shortness of breath,  lower extremity edema, dizziness, presyncope, or syncope.  The patient is otherwise without complaint today.  The patient denies symptoms of fevers, chills, cough, or new SOB worrisome for COVID 19.  She has a history of symptomatic PVCs currently on flecainide.  Her PVCs are rare occurring 1% of the time, though she is quite symptomatic from them.  Today, denies symptoms of chest pain, shortness of breath, orthopnea, PND, lower extremity edema, claudication, dizziness, presyncope, syncope, bleeding, or neurologic sequela. The patient is tolerating medications without difficulties.  Currently feeling well.  She is having minimal palpitations.  Her flecainide was not doing the job for her and thus she has stopped it.  She is continued to have palpitations, but she feels that she can get used to them.  She is also stop smoking, having quit cold [place] last February.  Past Medical History:  Diagnosis Date  . Abdominal pain 07/27/2017  . AMI (acute myocardial infarction) (HCC) 2006   "mild"  . Bronchitis  02/19/2017  . Diabetes mellitus without complication (HCC)   . Essential hypertension 02/19/2017  . Fatigue 02/19/2017  . GERD (gastroesophageal reflux disease) 05/02/2017  . Hyperlipidemia   . Hyperlipidemia LDL goal <70 02/19/2017  . Hypertension   . IBS (irritable bowel syndrome) 07/27/2017  . Ingrown toenail 04/27/2017  . Irritable bowel syndrome with constipation 02/19/2017  . Obesity (BMI 30.0-34.9) 04/27/2017  . Primary osteoarthritis of right knee 04/27/2017  . Tobacco abuse counseling 04/27/2017  . Type 2 diabetes mellitus with complication, without long-term current use of insulin (HCC) 02/19/2017  . Vitamin D deficiency 02/19/2017    Past Surgical History:  Procedure Laterality Date  . ABDOMINAL HYSTERECTOMY     due to menorrhagia  . APPENDECTOMY    . CESAREAN SECTION     x 4  . CHOLECYSTECTOMY    . JOINT REPLACEMENT     total left knee  . LUNG BIOPSY      Current Outpatient Medications  Medication Sig Dispense Refill  . albuterol (PROAIR HFA) 108 (90 Base) MCG/ACT inhaler Inhale into the lungs every 6 (six) hours as needed for wheezing or shortness of breath.    Marland Kitchen. aluminum chloride (DRYSOL) 20 % external solution Apply topically at bedtime. 35 mL 0  . amLODipine (NORVASC) 10 MG tablet Take 1 tablet (10 mg total) by mouth daily. 90 tablet 1  . atorvastatin (LIPITOR) 80 MG tablet Take 1 tablet (80 mg total) by mouth daily. 90 tablet 0  . buPROPion (WELLBUTRIN XL) 300 MG 24 hr tablet Take 300 mg by mouth daily.    .Marland Kitchen  [facility] ASPIRIN ADULT LOW DOSE 81 MG chewable tablet Chew 81 mg by mouth daily.    . fenofibrate (TRICOR) 145 MG tablet TAKE 1 TABLET BY MOUTH EVERY DAY 30 tablet 2  . glipiZIDE (GLIPIZIDE XL) 10 MG 24 hr tablet Take 1 tablet (10 mg total) by mouth daily with breakfast. 90 tablet 1  . lisinopril (PRINIVIL,ZESTRIL) 40 MG tablet Take 1 tablet (40 mg total) by mouth daily. 90 tablet 3  . metFORMIN (GLUCOPHAGE) 1000 MG tablet Take 1 tablet (1,000 mg total) by mouth 2 (two)  times daily. 60 tablet 3  . metoprolol succinate (TOPROL-XL) 50 MG 24 hr tablet Take 1 tablet (50 mg total) by mouth daily. Take with or immediately following a meal. 90 tablet 3  . montelukast (SINGULAIR) 10 MG tablet Take 1 tablet (10 mg total) by mouth at bedtime. 90 tablet 1  . omeprazole (PRILOSEC) 40 MG capsule TAKE 1 CAPSULE BY MOUTH EVERY DAY 30 capsule 5  . pregabalin (LYRICA) 50 MG capsule Take 1 capsule by mouth 3 (three) times daily.    Marland Kitchen triamterene-hydrochlorothiazide (MAXZIDE-25) 37.5-25 MG tablet Take 1 tablet by mouth daily. 90 tablet 3  . CHANTIX CONTINUING MONTH PAK 1 MG tablet TAKE 1 TABLET BY MOUTH TWICE A DAY 60 tablet 1  . flecainide (TAMBOCOR) 100 MG tablet Take 1 tablet (100 mg total) by mouth 2 (two) times daily. (Patient not taking: Reported on 06/20/2018) 180 tablet 1  . loperamide (IMODIUM A-D) 2 MG tablet Take 2 mg by mouth as needed for diarrhea or loose stools.    . Vitamin D, Ergocalciferol, (DRISDOL) 1.25 MG (50000 UT) CAPS capsule Take 1 capsule by mouth once a week.     No current facility-administered medications for this visit.     Allergies:   Latex   Social History:  The patient  reports that she has been smoking cigarettes. She has a 8.50 pack-year smoking history. She has never used smokeless tobacco. She reports that she does not drink alcohol or use drugs.   Family History:  The patient's  family history includes Cancer in her brother; Diabetes in her brother, brother, father, and mother; Glaucoma in her father and mother; Heart attack in her mother; Heart disease in her father; Hypertension in her brother, brother, father, and mother; Kidney disease in her brother; Throat cancer in her father.   ROS:  Please see the history of present illness.   All other systems are personally reviewed and negative.    Exam:    Vital Signs:  BP (!) 151/93   Pulse 93   Wt 210 lb (95.3 kg)   BMI 34.95 kg/m   Well appearing, alert and conversant, regular work  of breathing,  good skin color Eyes- anicteric, neuro- grossly intact, skin- no apparent rash or lesions or cyanosis, mouth- oral mucosa is pink  Labs/Other Tests and Data Reviewed:    Recent Labs: 07/01/2017: BUN 24; Creat 1.09; Potassium 4.4; Sodium 141 09/15/2017: ALT 19   Wt Readings from Last 3 Encounters:  06/20/18 210 lb (95.3 kg)  02/28/18 184 lb (83.5 kg)  11/29/17 191 lb (86.6 kg)     Other studies personally reviewed: Additional studies/ records that were reviewed today include: TTE 02/28/18  Review of the above records today demonstrates:  SR, rate 69   ASSESSMENT & PLAN:    1.  PVCs: Less than 1% noted on cardiac monitor.  She is stopped her flecainide, and though she is continuing to have PVCs,  she is not completely bothered by them.  We Brent Noto continue to monitor.  Her blood pressure is elevated today but it is usually less than 130.  We Narya Beavin make no changes to that.  2.  Tobacco abuse: Congratulated on cessation   COVID 19 screen The patient denies symptoms of COVID 19 at this time.  The importance of social distancing was discussed today.  Follow-up: 1 year   Current medicines are reviewed at length with the patient today.   The patient does not have concerns regarding her medicines.  The following changes were made today:  none  Labs/ tests ordered today include:  No orders of the defined types were placed in this encounter.    Patient Risk:  after full review of this patients clinical status, I feel that they are at moderate risk at this time.  Today, I have spent 9 minutes with the patient with telehealth technology discussing PVCs.    Signed, Corday Wyka Jorja LoaMartin Stiles Maxcy, MD  06/20/2018 8:31 AM     [facility] [address] [place] [address] [phone] (office) [phone] (fax)

## 2018-06-20 NOTE — Patient Instructions (Signed)
Medication Instructions:  Your physician recommends that you continue on your current medications as directed. Please refer to the Current Medication list given to you today.  If you need a refill on your cardiac medications before your next appointment, please call your pharmacy.   Labwork: None ordered  Testing/Procedures: None ordered  Follow-Up: Your physician wants you to follow-up in: 1 year with Dr. Camnitz.  You will receive a reminder letter in the mail two months in advance. If you don't receive a letter, please call our office to schedule the follow-up appointment.  Thank you for choosing [facility]!!   Lareta Bruneau, RN [phone]  Any Other Special Instructions Will Be Listed Below (If Applicable).        

## 2018-06-20 NOTE — Addendum Note (Signed)
Addended by: Stanton Kidney on: 06/20/2018 09:52 AM   Modules accepted: Orders

## 2018-07-12 ENCOUNTER — Other Ambulatory Visit (HOSPITAL_COMMUNITY): Payer: Self-pay | Admitting: Family Medicine

## 2018-07-12 DIAGNOSIS — Z1231 Encounter for screening mammogram for malignant neoplasm of breast: Secondary | ICD-10-CM

## 2018-07-19 ENCOUNTER — Ambulatory Visit (HOSPITAL_COMMUNITY)
Admission: RE | Admit: 2018-07-19 | Discharge: 2018-07-19 | Disposition: A | Discharge status: Home / Self Care | Payer: Medicare Other | Source: Ambulatory Visit | Attending: Family Medicine | Admitting: Family Medicine

## 2018-07-19 ENCOUNTER — Other Ambulatory Visit: Payer: Self-pay

## 2018-07-19 DIAGNOSIS — Z1231 Encounter for screening mammogram for malignant neoplasm of breast: Secondary | ICD-10-CM | POA: Diagnosis not present

## 2018-07-19 NOTE — Patient Outreach (Signed)
[facility]) Care Management  07/19/2018  Tasha Aguilar [birth year] [id number]   Medication Adherence call to [name] Hippa Identifiers Verify spoke with patient she is past due on Lisinopril 40 mg and Atorvastatin 80 mg patient explain she has medication until the end of the month and will order it then patient receives a pill pack [facility] month [facility]. [name] is showing past due under [place].  [facility] Management Direct Dial [phone]  Fax [phone] [email or web address]

## 2018-07-20 ENCOUNTER — Other Ambulatory Visit (HOSPITAL_COMMUNITY): Payer: Self-pay | Admitting: Family Medicine

## 2018-07-20 DIAGNOSIS — N6489 Other specified disorders of breast: Secondary | ICD-10-CM

## 2018-07-25 ENCOUNTER — Ambulatory Visit (HOSPITAL_COMMUNITY)
Admission: RE | Admit: 2018-07-25 | Discharge: 2018-07-25 | Disposition: A | Discharge status: Home / Self Care | Payer: Medicare Other | Source: Ambulatory Visit | Attending: Family Medicine | Admitting: Family Medicine

## 2018-07-25 ENCOUNTER — Other Ambulatory Visit: Payer: Self-pay

## 2018-07-25 ENCOUNTER — Other Ambulatory Visit (HOSPITAL_COMMUNITY): Payer: Self-pay | Admitting: Family Medicine

## 2018-07-25 DIAGNOSIS — N6489 Other specified disorders of breast: Secondary | ICD-10-CM | POA: Insufficient documentation

## 2018-07-25 DIAGNOSIS — R921 Mammographic calcification found on diagnostic imaging of breast: Secondary | ICD-10-CM | POA: Diagnosis not present

## 2018-07-25 DIAGNOSIS — R928 Other abnormal and inconclusive findings on diagnostic imaging of breast: Secondary | ICD-10-CM | POA: Diagnosis not present

## 2018-07-25 DIAGNOSIS — N6313 Unspecified lump in the right breast, lower outer quadrant: Secondary | ICD-10-CM | POA: Diagnosis not present

## 2018-07-25 DIAGNOSIS — N6459 Other signs and symptoms in breast: Secondary | ICD-10-CM | POA: Diagnosis not present

## 2018-09-04 DIAGNOSIS — E1142 Type 2 diabetes mellitus with diabetic polyneuropathy: Secondary | ICD-10-CM | POA: Diagnosis not present

## 2018-09-04 DIAGNOSIS — Z23 Encounter for immunization: Secondary | ICD-10-CM | POA: Diagnosis not present

## 2018-09-04 DIAGNOSIS — E1140 Type 2 diabetes mellitus with diabetic neuropathy, unspecified: Secondary | ICD-10-CM | POA: Diagnosis not present

## 2018-09-04 DIAGNOSIS — I10 Essential (primary) hypertension: Secondary | ICD-10-CM | POA: Diagnosis not present

## 2018-09-04 DIAGNOSIS — E785 Hyperlipidemia, unspecified: Secondary | ICD-10-CM | POA: Diagnosis not present

## 2018-09-21 DIAGNOSIS — E1140 Type 2 diabetes mellitus with diabetic neuropathy, unspecified: Secondary | ICD-10-CM | POA: Diagnosis not present

## 2018-09-21 DIAGNOSIS — E785 Hyperlipidemia, unspecified: Secondary | ICD-10-CM | POA: Diagnosis not present

## 2018-09-21 DIAGNOSIS — I10 Essential (primary) hypertension: Secondary | ICD-10-CM | POA: Diagnosis not present

## 2018-09-27 ENCOUNTER — Ambulatory Visit: Payer: Medicare Other | Admitting: Orthopaedic Surgery

## 2018-10-03 ENCOUNTER — Ambulatory Visit (INDEPENDENT_AMBULATORY_CARE_PROVIDER_SITE_OTHER): Payer: Medicare Other | Admitting: Orthopaedic Surgery

## 2018-10-03 ENCOUNTER — Encounter: Payer: Self-pay | Admitting: Orthopaedic Surgery

## 2018-10-03 ENCOUNTER — Ambulatory Visit: Payer: Self-pay

## 2018-10-03 ENCOUNTER — Other Ambulatory Visit: Payer: Self-pay

## 2018-10-03 DIAGNOSIS — M5136 Other intervertebral disc degeneration, lumbar region: Secondary | ICD-10-CM | POA: Diagnosis not present

## 2018-10-03 DIAGNOSIS — M545 Low back pain, unspecified: Secondary | ICD-10-CM

## 2018-10-03 DIAGNOSIS — G8929 Other chronic pain: Secondary | ICD-10-CM | POA: Diagnosis not present

## 2018-10-03 DIAGNOSIS — M25569 Pain in unspecified knee: Secondary | ICD-10-CM | POA: Diagnosis not present

## 2018-10-03 MED ORDER — METHYLPREDNISOLONE 4 MG PO TBPK: ORAL_TABLET | ORAL | 0 refills | Status: DC

## 2018-10-03 MED ORDER — TIZANIDINE HCL 2 MG PO TABS: 2.0000 mg | ORAL_TABLET | Freq: Three times a day (TID) | ORAL | 0 refills | Status: DC | PRN

## 2018-10-03 NOTE — Progress Notes (Signed)
Office Visit Note   Patient: Tasha Aguilar           Date of Birth: 12-26-1965           MRN: [id number] Visit Date: 10/03/2018              Requested by: Aliene Beams, MD [address] [place],  [place] [address] PCP: Aliene Beams, MD   Assessment & Plan: Visit Diagnoses:  1. Low back pain, unspecified back pain laterality, unspecified chronicity, unspecified whether sciatica present     Plan: Impression is chronic lower back pain with radiculopathy.  The patient would like to be referred to a neurosurgeon for surgical consultation.  We will make the referral to Dr. Conchita Paris for this.  She will follow-up with [place] as needed.  Follow-Up Instructions: Return if symptoms worsen or fail to improve.   Orders:  Orders Placed This Encounter  Procedures   XR Lumbar Spine 2-3 Views   Ambulatory referral to Neurosurgery   Meds ordered this encounter  Medications   methylPREDNISolone (MEDROL DOSEPAK) 4 MG TBPK tablet    Sig: Take as directed    Dispense:  21 tablet    Refill:  0   tiZANidine (ZANAFLEX) 2 MG tablet    Sig: Take 1 tablet (2 mg total) by mouth 3 (three) times daily as needed for muscle spasms.    Dispense:  30 tablet    Refill:  0      Procedures: No procedures performed   Clinical Data: No additional findings.   Subjective: Chief Complaint  Patient presents with   Lower Back - Pain    HPI patient is a pleasant 53 year old female who presents our clinic today with chronic midline lower back pain radiating to the right side and occasionally down the back of both legs.  She occasionally has pain to the top of the right thigh.  No groin pain.  This is been ongoing for the past 10 years.  No specific injury leading up to her symptoms.  Pain she has is constant and is worse with bending over as well as lifting something heavy.  She has been taking pregabalin for this.  She notes numbness to the lateral thigh as well as to both feet.  She does have a  history of diabetic neuropathy and is unsure whether this is related.  She notes that she has had multiple ESI's as frequently as 1 month apart while living in [place], [place] and in [place].  She currently denies any bowel or bladder change or saddle paresthesias.  No weakness to either lower extremity.  Review of Systems as detailed in HPI.  All others reviewed and are negative.   Objective: Vital Signs: There were no vitals taken for this visit.  Physical Exam well-developed and well-nourished female in no acute distress.  Alert and oriented x3.  Ortho Exam examination of her lumbar spine reveals mild spinous tenderness.  Increased tenderness to the right paraspinous musculature.  Markedly positive straight leg raise on the left.  Negative on the right.  No focal weakness.  She is neurovascular intact distally.  Specialty Comments:  No specialty comments available.  Imaging: Xr Lumbar Spine 2-3 Views  Result Date: 10/03/2018 X-rays demonstrate diffuse lumbar spondylosis    PMFS History: Patient Active Problem List   Diagnosis Date Noted   IBS (irritable bowel syndrome) 07/27/2017   Abdominal pain 07/27/2017   GERD (gastroesophageal reflux disease) 05/02/2017   Tobacco abuse counseling  04/27/2017   Obesity (BMI 30.0-34.9) 04/27/2017   Ingrown toenail 04/27/2017   Primary osteoarthritis of right knee 04/27/2017   Essential hypertension 02/19/2017   Hyperlipidemia LDL goal <70 02/19/2017   Type 2 diabetes mellitus with complication, without long-term current use of insulin ([place]) 02/19/2017   Bronchitis 02/19/2017   Screening for breast cancer 02/19/2017   Immunization due 02/19/2017   Screen for colon cancer 02/19/2017   Vitamin D deficiency 02/19/2017   Fatigue 02/19/2017   Past Medical History:  Diagnosis Date   Abdominal pain 07/27/2017   AMI (acute myocardial infarction) ([place]) 2006   "mild"   Bronchitis 02/19/2017   Diabetes mellitus  without complication ([place])    Essential hypertension 02/19/2017   Fatigue 02/19/2017   GERD (gastroesophageal reflux disease) 05/02/2017   Hyperlipidemia    Hyperlipidemia LDL goal <70 02/19/2017   Hypertension    IBS (irritable bowel syndrome) 07/27/2017   Ingrown toenail 04/27/2017   Irritable bowel syndrome with constipation 02/19/2017   Obesity (BMI 30.0-34.9) 04/27/2017   Primary osteoarthritis of right knee 04/27/2017   Tobacco abuse counseling 04/27/2017   Type 2 diabetes mellitus with complication, without long-term current use of insulin ([place]) 02/19/2017   Vitamin D deficiency 02/19/2017    Family History  Problem Relation Age of Onset   Diabetes Mother    Glaucoma Mother    Hypertension Mother    Heart attack Mother    Hypertension Father    Diabetes Father    Heart disease Father    Glaucoma Father    Throat cancer Father    Kidney disease Brother    Diabetes Brother    Hypertension Brother    Hypertension Brother    Diabetes Brother    Cancer Brother        angiosarcoma   Colon cancer Neg Hx    Gastric cancer Neg Hx    Esophageal cancer Neg Hx     Past Surgical History:  Procedure Laterality Date   ABDOMINAL HYSTERECTOMY     due to menorrhagia   APPENDECTOMY     CESAREAN SECTION     x 4   CHOLECYSTECTOMY     JOINT REPLACEMENT     total left knee   LUNG BIOPSY     Social History   Occupational History   Not on file  Tobacco Use   Smoking status: Current Every Day Smoker    Packs/day: 0.25    Years: 34.00    Pack years: 8.50    Types: Cigarettes   Smokeless tobacco: Never Used  Substance and Sexual Activity   Alcohol use: No    Frequency: Never   Drug use: No   Sexual activity: Yes    Birth control/protection: Surgical

## 2018-10-18 DIAGNOSIS — I10 Essential (primary) hypertension: Secondary | ICD-10-CM | POA: Diagnosis not present

## 2018-10-18 DIAGNOSIS — M545 Low back pain: Secondary | ICD-10-CM | POA: Diagnosis not present

## 2018-10-20 ENCOUNTER — Other Ambulatory Visit (HOSPITAL_COMMUNITY): Payer: Self-pay | Admitting: Physician Assistant

## 2018-10-20 ENCOUNTER — Other Ambulatory Visit: Payer: Self-pay | Admitting: Physician Assistant

## 2018-10-20 DIAGNOSIS — G8929 Other chronic pain: Secondary | ICD-10-CM

## 2018-10-28 ENCOUNTER — Ambulatory Visit (HOSPITAL_COMMUNITY)
Admission: RE | Admit: 2018-10-28 | Discharge: 2018-10-28 | Disposition: A | Discharge status: Home / Self Care | Payer: Medicare Other | Source: Ambulatory Visit | Attending: Physician Assistant | Admitting: Physician Assistant

## 2018-10-28 ENCOUNTER — Other Ambulatory Visit: Payer: Self-pay

## 2018-10-28 DIAGNOSIS — G8929 Other chronic pain: Secondary | ICD-10-CM

## 2018-10-28 DIAGNOSIS — M5440 Lumbago with sciatica, unspecified side: Secondary | ICD-10-CM | POA: Diagnosis not present

## 2018-10-28 DIAGNOSIS — M545 Low back pain: Secondary | ICD-10-CM | POA: Diagnosis not present

## 2019-02-19 ENCOUNTER — Other Ambulatory Visit: Payer: Self-pay

## 2019-02-19 NOTE — Patient Outreach (Signed)
[facility]) Care Management  02/19/2019  DARELY BECKNELL 1965/01/26 [id number]   Medication Adherence call to Mrs. Vance Peper Telephone call to Patient regarding Medication Adherence unable to reach patient,patient did not answer and voice mail is not set up to leave messages. Mrs. Votaw is showing past due on Rosuvastatin 20 mg and Lisinopril 40 mg under [employer].   Lillia Abed CPhT Pharmacy Technician [facility] Management Direct Dial [phone]  Fax [phone] [email or web address]

## 2019-03-06 ENCOUNTER — Other Ambulatory Visit: Payer: Self-pay

## 2019-03-06 NOTE — Patient Outreach (Signed)
[facility]) Care Management  03/06/2019  GEMMA RUAN January 23, 1965 [id number]   Medication Adherence call to Mrs. Vance Peper HIPPA Compliant Voice message left with a call back number. Mrs. Khalid is showing past due on Rosuvastatin 20 mg and Lisinopril 40 mg under [employer].  Lillia Abed CPhT Pharmacy Technician [facility] Management Direct Dial [phone]  Fax [phone] [email or web address]

## 2019-03-30 DIAGNOSIS — F17200 Nicotine dependence, unspecified, uncomplicated: Secondary | ICD-10-CM | POA: Diagnosis not present

## 2019-03-30 DIAGNOSIS — I10 Essential (primary) hypertension: Secondary | ICD-10-CM | POA: Diagnosis not present

## 2019-03-30 DIAGNOSIS — M25562 Pain in left knee: Secondary | ICD-10-CM | POA: Diagnosis not present

## 2019-03-30 DIAGNOSIS — R52 Pain, unspecified: Secondary | ICD-10-CM | POA: Diagnosis not present

## 2019-03-30 DIAGNOSIS — E119 Type 2 diabetes mellitus without complications: Secondary | ICD-10-CM | POA: Diagnosis not present

## 2019-03-30 DIAGNOSIS — Z91040 Latex allergy status: Secondary | ICD-10-CM | POA: Diagnosis not present

## 2019-03-30 DIAGNOSIS — Z96652 Presence of left artificial knee joint: Secondary | ICD-10-CM | POA: Diagnosis not present

## 2019-03-30 DIAGNOSIS — W19XXXA Unspecified fall, initial encounter: Secondary | ICD-10-CM | POA: Diagnosis not present

## 2019-04-12 DIAGNOSIS — E1140 Type 2 diabetes mellitus with diabetic neuropathy, unspecified: Secondary | ICD-10-CM | POA: Diagnosis not present

## 2019-04-12 DIAGNOSIS — I10 Essential (primary) hypertension: Secondary | ICD-10-CM | POA: Diagnosis not present

## 2019-04-12 DIAGNOSIS — E785 Hyperlipidemia, unspecified: Secondary | ICD-10-CM | POA: Diagnosis not present

## 2019-04-12 DIAGNOSIS — E1142 Type 2 diabetes mellitus with diabetic polyneuropathy: Secondary | ICD-10-CM | POA: Diagnosis not present

## 2019-04-12 DIAGNOSIS — N649 Disorder of breast, unspecified: Secondary | ICD-10-CM | POA: Diagnosis not present

## 2019-04-13 ENCOUNTER — Other Ambulatory Visit (HOSPITAL_COMMUNITY): Payer: Self-pay | Admitting: Family Medicine

## 2019-04-13 DIAGNOSIS — N649 Disorder of breast, unspecified: Secondary | ICD-10-CM

## 2019-05-01 ENCOUNTER — Ambulatory Visit (HOSPITAL_COMMUNITY)
Admission: RE | Admit: 2019-05-01 | Discharge: 2019-05-01 | Disposition: A | Discharge status: Home / Self Care | Payer: Medicare Other | Source: Ambulatory Visit | Attending: Family Medicine | Admitting: Family Medicine

## 2019-05-01 ENCOUNTER — Other Ambulatory Visit: Payer: Self-pay

## 2019-05-01 DIAGNOSIS — R921 Mammographic calcification found on diagnostic imaging of breast: Secondary | ICD-10-CM | POA: Diagnosis not present

## 2019-05-01 DIAGNOSIS — N649 Disorder of breast, unspecified: Secondary | ICD-10-CM | POA: Diagnosis not present

## 2019-05-01 DIAGNOSIS — N6489 Other specified disorders of breast: Secondary | ICD-10-CM | POA: Diagnosis not present

## 2019-05-25 DIAGNOSIS — Z96652 Presence of left artificial knee joint: Secondary | ICD-10-CM | POA: Diagnosis not present

## 2019-05-25 DIAGNOSIS — T8484XA Pain due to internal orthopedic prosthetic devices, implants and grafts, initial encounter: Secondary | ICD-10-CM | POA: Diagnosis not present

## 2019-08-01 ENCOUNTER — Other Ambulatory Visit (HOSPITAL_COMMUNITY): Payer: Self-pay | Admitting: Family Medicine

## 2019-08-01 DIAGNOSIS — Z1231 Encounter for screening mammogram for malignant neoplasm of breast: Secondary | ICD-10-CM

## 2019-08-06 ENCOUNTER — Ambulatory Visit (HOSPITAL_COMMUNITY): Payer: Medicare Other

## 2019-09-04 DIAGNOSIS — E785 Hyperlipidemia, unspecified: Secondary | ICD-10-CM | POA: Diagnosis not present

## 2019-09-04 DIAGNOSIS — I10 Essential (primary) hypertension: Secondary | ICD-10-CM | POA: Diagnosis not present

## 2019-09-04 DIAGNOSIS — N649 Disorder of breast, unspecified: Secondary | ICD-10-CM | POA: Diagnosis not present

## 2019-09-04 DIAGNOSIS — E1140 Type 2 diabetes mellitus with diabetic neuropathy, unspecified: Secondary | ICD-10-CM | POA: Diagnosis not present

## 2019-09-11 ENCOUNTER — Ambulatory Visit (INDEPENDENT_AMBULATORY_CARE_PROVIDER_SITE_OTHER): Payer: Medicare Other | Admitting: Orthopaedic Surgery

## 2019-09-11 ENCOUNTER — Encounter: Payer: Self-pay | Admitting: Orthopaedic Surgery

## 2019-09-11 ENCOUNTER — Ambulatory Visit: Payer: Self-pay

## 2019-09-11 VITALS — Ht 66.5 in | Wt 179.2 lb

## 2019-09-11 DIAGNOSIS — M545 Low back pain, unspecified: Secondary | ICD-10-CM

## 2019-09-11 MED ORDER — METHOCARBAMOL 500 MG PO TABS: 500.0000 mg | ORAL_TABLET | Freq: Two times a day (BID) | ORAL | 0 refills | Status: AC | PRN

## 2019-09-11 MED ORDER — PREDNISONE 5 MG (21) PO TBPK: ORAL_TABLET | ORAL | 0 refills | Status: DC

## 2019-09-11 NOTE — Progress Notes (Signed)
Office Visit Note   Patient: Tasha Aguilar           Date of Birth: [birth year]           MRN: [id number] Visit Date: 09/11/2019              Requested by: Aliene Beams, MD [address] [place],  [place] [address] PCP: Aliene Beams, MD   Assessment & Plan: Visit Diagnoses:  1. Low back pain, unspecified back pain laterality, unspecified chronicity, unspecified whether sciatica present     Plan: Impression is chronic midline lower back pain and bilateral lower extremity radiculopathy with findings on MRI of advanced facet degeneration L4-5.  Patient would like to try a course of steroids for which I have called in today.  We will also refer her to physical therapy.  If she does not have significant relief of symptoms over the next 6 weeks she will let us know and we will refer her to Dr. Alvester Morin for [facility].  Follow-Up Instructions: Return if symptoms worsen or fail to improve.   Orders:  Orders Placed This Encounter  Procedures  . XR Lumbar Spine 2-3 Views  . Ambulatory referral to Physical Therapy   Meds ordered this encounter  Medications  . predniSONE (STERAPRED UNI-PAK 21 TAB) 5 MG (21) TBPK tablet    Sig: Take as directed    Dispense:  21 tablet    Refill:  0  . methocarbamol (ROBAXIN) 500 MG tablet    Sig: Take 1 tablet (500 mg total) by mouth 2 (two) times daily as needed.    Dispense:  20 tablet    Refill:  0      Procedures: No procedures performed   Clinical Data: No additional findings.   Subjective: Chief Complaint  Patient presents with  . Lower Back - Pain    HPI patient is a pleasant 54 year old female who comes in today with continued midline lower back pain and bilateral lower extremity radiculopathy which she has been having for over a year now.  She notes that her symptoms are worse with any lifting, pushing or pulling.  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  She has used Biofreeze without significant relief of  symptoms.  She notes occasional numbness and tingling to the back of both legs.  She is also had epidural steroid injections in the past with relief of symptoms.  Subsequent MRI of the lumbar spine ordered which shows multilevel facet degeneration worse L4-5 with moderate spinal stenosis.  Review of Systems as detailed in HPI.  All others reviewed and are negative.   Objective: Vital Signs: Ht 5' 6.5" (1.689 m)   Wt 179 lb 3.2 oz (81.3 kg)   BMI 28.49 kg/m   Physical Exam well-developed well-nourished female no acute distress.  Alert oriented x3.  Ortho Exam examination of her lumbar spine reveals no spinous tenderness.  She does have moderate bilateral paraspinous tenderness.  Positive straight leg raise on the left negative on the right.  No focal weakness.  She is neurovascular intact distally.  Specialty Comments:  No specialty comments available.  Imaging: No new imaging   PMFS History: Patient Active Problem List   Diagnosis Date Noted  . IBS (irritable bowel syndrome) 07/27/2017  . Abdominal pain 07/27/2017  . GERD (gastroesophageal reflux disease) 05/02/2017  . Tobacco abuse counseling 04/27/2017  . Obesity (BMI 30.0-34.9) 04/27/2017  . Ingrown toenail 04/27/2017  . Primary osteoarthritis  of right knee 04/27/2017  . Essential hypertension 02/19/2017  . Hyperlipidemia LDL goal <70 02/19/2017  . Type 2 diabetes mellitus with complication, without long-term current use of insulin (HCC) 02/19/2017  . Bronchitis 02/19/2017  . Screening for breast cancer 02/19/2017  . Immunization due 02/19/2017  . Screen for colon cancer 02/19/2017  . Vitamin D deficiency 02/19/2017  . Fatigue 02/19/2017   Past Medical History:  Diagnosis Date  . Abdominal pain 07/27/2017  . AMI (acute myocardial infarction) (HCC) 2006   "mild"  . Bronchitis 02/19/2017  . Diabetes mellitus without complication (HCC)   . Essential hypertension 02/19/2017  . Fatigue 02/19/2017  . GERD (gastroesophageal  reflux disease) 05/02/2017  . Hyperlipidemia   . Hyperlipidemia LDL goal <70 02/19/2017  . Hypertension   . IBS (irritable bowel syndrome) 07/27/2017  . Ingrown toenail 04/27/2017  . Irritable bowel syndrome with constipation 02/19/2017  . Obesity (BMI 30.0-34.9) 04/27/2017  . Primary osteoarthritis of right knee 04/27/2017  . Tobacco abuse counseling 04/27/2017  . Type 2 diabetes mellitus with complication, without long-term current use of insulin (HCC) 02/19/2017  . Vitamin D deficiency 02/19/2017    Family History  Problem Relation Age of Onset  . Diabetes Mother   . Glaucoma Mother   . Hypertension Mother   . Heart attack Mother   . Hypertension Father   . Diabetes Father   . Heart disease Father   . Glaucoma Father   . Throat cancer Father   . Kidney disease Brother   . Diabetes Brother   . Hypertension Brother   . Hypertension Brother   . Diabetes Brother   . Cancer Brother        angiosarcoma  . Colon cancer Neg Hx   . Gastric cancer Neg Hx   . Esophageal cancer Neg Hx     Past Surgical History:  Procedure Laterality Date  . ABDOMINAL HYSTERECTOMY     due to menorrhagia  . APPENDECTOMY    . CESAREAN SECTION     x 4  . CHOLECYSTECTOMY    . JOINT REPLACEMENT     total left knee  . LUNG BIOPSY     Social History   Occupational History  . Not on file  Tobacco Use  . Smoking status: Current Every Day Smoker    Packs/day: 0.25    Years: 34.00    Pack years: 8.50    Types: Cigarettes  . Smokeless tobacco: Never Used  Vaping Use  . Vaping Use: Never used  Substance and Sexual Activity  . Alcohol use: No  . Drug use: No  . Sexual activity: Yes    Birth control/protection: Surgical

## 2019-09-15 ENCOUNTER — Other Ambulatory Visit: Payer: Self-pay | Admitting: Physician Assistant

## 2019-09-24 NOTE — Progress Notes (Deleted)
Cardiology Office Note Date:  09/24/2019  Patient ID:  Tasha Aguilar, [place] 1965-07-13, MRN [id number] PCP:  Aliene Beams, MD  Cardiologist/Electrophysiologist: Dr. Elberta Fortis  ***refresh   Chief Complaint: *** annual visit  History of Present Illness: Tasha Aguilar is a 54 y.o. female with history of DM, HTN, HLD, IBS, obesity, OA and symptomatic PVCs   She comes in today to be seen for Dr. Elberta Fortis, last seen by him via telehealth visit June 2020.  She had stopped the flecainide given no improvement in her PVCs and had quit smoking as well.  Her PVCs were no longer as bothersome as they had previously been, burden was 1% on a prior monitor.  No changes were made with plans for an an annual visit.  *** symptoms *** labs, lipids *** exercise, ? Apnea?    Past Medical History:  Diagnosis Date  . Abdominal pain 07/27/2017  . AMI (acute myocardial infarction) (HCC) 2006   "mild"  . Bronchitis 02/19/2017  . Diabetes mellitus without complication (HCC)   . Essential hypertension 02/19/2017  . Fatigue 02/19/2017  . GERD (gastroesophageal reflux disease) 05/02/2017  . Hyperlipidemia   . Hyperlipidemia LDL goal <70 02/19/2017  . Hypertension   . IBS (irritable bowel syndrome) 07/27/2017  . Ingrown toenail 04/27/2017  . Irritable bowel syndrome with constipation 02/19/2017  . Obesity (BMI 30.0-34.9) 04/27/2017  . Primary osteoarthritis of right knee 04/27/2017  . Tobacco abuse counseling 04/27/2017  . Type 2 diabetes mellitus with complication, without long-term current use of insulin (HCC) 02/19/2017  . Vitamin D deficiency 02/19/2017    Past Surgical History:  Procedure Laterality Date  . ABDOMINAL HYSTERECTOMY     due to menorrhagia  . APPENDECTOMY    . CESAREAN SECTION     x 4  . CHOLECYSTECTOMY    . JOINT REPLACEMENT     total left knee  . LUNG BIOPSY      Current Outpatient Medications  Medication Sig Dispense Refill  . albuterol (PROAIR HFA) 108 (90 Base) MCG/ACT inhaler  Inhale into the lungs every 6 (six) hours as needed for wheezing or shortness of breath.    Marland Kitchen aluminum chloride (DRYSOL) 20 % external solution Apply topically at bedtime. 35 mL 0  . amLODipine (NORVASC) 10 MG tablet Take 1 tablet (10 mg total) by mouth daily. 90 tablet 1  . atorvastatin (LIPITOR) 80 MG tablet Take 1 tablet (80 mg total) by mouth daily. 90 tablet 0  . buPROPion (WELLBUTRIN XL) 300 MG 24 hr tablet Take 300 mg by mouth daily.    . CHANTIX CONTINUING MONTH PAK 1 MG tablet TAKE 1 TABLET BY MOUTH TWICE A DAY 60 tablet 1  . [facility]S ASPIRIN ADULT LOW DOSE 81 MG chewable tablet Chew 81 mg by mouth daily.    . fenofibrate (TRICOR) 145 MG tablet TAKE 1 TABLET BY MOUTH EVERY DAY 30 tablet 2  . glipiZIDE (GLIPIZIDE XL) 10 MG 24 hr tablet Take 1 tablet (10 mg total) by mouth daily with breakfast. 90 tablet 1  . lisinopril (PRINIVIL,ZESTRIL) 40 MG tablet Take 1 tablet (40 mg total) by mouth daily. 90 tablet 3  . loperamide (IMODIUM A-D) 2 MG tablet Take 2 mg by mouth as needed for diarrhea or loose stools.    . metFORMIN (GLUCOPHAGE) 1000 MG tablet Take 1 tablet (1,000 mg total) by mouth 2 (two) times daily. 60 tablet 3  . methocarbamol (ROBAXIN) 500 MG tablet Take 1 tablet (500 mg total) by mouth  2 (two) times daily as needed. 20 tablet 0  . methylPREDNISolone (MEDROL DOSEPAK) 4 MG TBPK tablet Take as directed 21 tablet 0  . metoprolol succinate (TOPROL-XL) 50 MG 24 hr tablet Take 1 tablet (50 mg total) by mouth daily. Take with or immediately following a meal. 90 tablet 3  . montelukast (SINGULAIR) 10 MG tablet Take 1 tablet (10 mg total) by mouth at bedtime. 90 tablet 1  . omeprazole (PRILOSEC) 40 MG capsule TAKE 1 CAPSULE BY MOUTH EVERY DAY 30 capsule 5  . predniSONE (STERAPRED UNI-PAK 21 TAB) 5 MG (21) TBPK tablet Take as directed 21 tablet 0  . pregabalin (LYRICA) 50 MG capsule Take 1 capsule by mouth 3 (three) times daily.    Marland Kitchen tiZANidine (ZANAFLEX) 2 MG tablet Take 1 tablet (2 mg total) by  mouth 3 (three) times daily as needed for muscle spasms. 30 tablet 0  . triamterene-hydrochlorothiazide (MAXZIDE-25) 37.5-25 MG tablet Take 1 tablet by mouth daily. 90 tablet 3  . Vitamin D, Ergocalciferol, (DRISDOL) 1.25 MG (50000 UT) CAPS capsule Take 1 capsule by mouth once a week.     No current facility-administered medications for this visit.    Allergies:   Latex   Social History:  The patient  reports that she has been smoking cigarettes. She has a 8.50 pack-year smoking history. She has never used smokeless tobacco. She reports that she does not drink alcohol and does not use drugs.   Family History:  The patient's family history includes Cancer in her brother; Diabetes in her brother, brother, father, and mother; Glaucoma in her father and mother; Heart attack in her mother; Heart disease in her father; Hypertension in her brother, brother, father, and mother; Kidney disease in her brother; Throat cancer in her father.***  ROS:  Please see the history of present illness.    All other systems are reviewed and otherwise negative.   PHYSICAL EXAM:  VS:  There were no vitals taken for this visit. BMI: There is no height or weight on file to calculate BMI. Well nourished, well developed, in no acute distress HEENT: normocephalic, atraumatic Neck: no JVD, carotid bruits or masses Cardiac:  *** RRR; no significant murmurs, no rubs, or gallops Lungs:  *** CTA b/l, no wheezing, rhonchi or rales Abd: soft, nontender MS: no deformity or *** atrophy Ext: *** no edema Skin: warm and dry, no rash Neuro:  No gross deficits appreciated Psych: euthymic mood, full affect    EKG:  Done today and reviewed by myself shows  ***  Nov 2019, monitor Minimum: 57 bpm (00:17:50 Day 14) Maximum: 129 bpm (13:17:20 Day 7) Average: 73 bpm Rare ventricular and supraventricular ectopy 0% atrial fibrillation burden Intermittent couplets and ventricular runs, maximum 3 beats   10/31/2015: stress  myoview Mild anterior wall attenuation artifcat Normal LV wall motion and function LVEF57%   Recent Labs: No results found for requested labs within last 8760 hours.  No results found for requested labs within last 8760 hours.   CrCl cannot be calculated (Patient's most recent lab result is older than the maximum 21 days allowed.).   Wt Readings from Last 3 Encounters:  09/11/19 179 lb 3.2 oz (81.3 kg)  06/20/18 210 lb (95.3 kg)  02/28/18 184 lb (83.5 kg)     Other studies reviewed: Additional studies/records reviewed today include: summarized above  ASSESSMENT AND PLAN:  1. PVCs     ***  2. HTN     ***  Disposition: F/u with ***  Current medicines are reviewed at length with the patient today.  The patient did not have any concerns regarding medicines.  Norma Fredrickson, PA-C 09/24/2019 12:31 PM     [facility] [address] [place] [address] [phone] (office)  [phone] (fax)

## 2019-09-25 ENCOUNTER — Ambulatory Visit: Payer: Self-pay

## 2019-09-25 ENCOUNTER — Ambulatory Visit (INDEPENDENT_AMBULATORY_CARE_PROVIDER_SITE_OTHER): Payer: Medicare Other | Admitting: Orthopaedic Surgery

## 2019-09-25 ENCOUNTER — Encounter: Payer: Self-pay | Admitting: Orthopaedic Surgery

## 2019-09-25 VITALS — Ht 66.0 in | Wt 175.0 lb

## 2019-09-25 DIAGNOSIS — S92254A Nondisplaced fracture of navicular [scaphoid] of right foot, initial encounter for closed fracture: Secondary | ICD-10-CM

## 2019-09-25 MED ORDER — TRAMADOL HCL 50 MG PO TABS: 50.0000 mg | ORAL_TABLET | Freq: Two times a day (BID) | ORAL | 0 refills | Status: DC | PRN

## 2019-09-25 NOTE — Progress Notes (Signed)
Office Visit Note   Patient: Tasha Aguilar           Date of Birth: 09-02-1965           MRN: [id number] Visit Date: 09/25/2019              Requested by: Aliene Beams, MD [id number] [place],  [place] [address] PCP: Aliene Beams, MD   Assessment & Plan: Visit Diagnoses:  1. Closed nondisplaced fracture of navicular bone of right foot, initial encounter     Plan: Impression is right ankle avulsion fracture to the navicular as well as grade 1-2 ankle sprain ATFL.  I do not believe the patient symptoms are bad enough to warrant a cam walker, however we will provide her with an ASO.  She will instability as needed.  Offloaded tramadol.  She will follow up with [place] in 3 weeks time for repeat evaluation and 3 view x-rays of the right ankle.  Follow-Up Instructions: Return in about 3 months (around 12/25/2019).   Orders:  Orders Placed This Encounter  Procedures  . XR Ankle Complete Right   Meds ordered this encounter  Medications  . traMADol (ULTRAM) 50 MG tablet    Sig: Take 1 tablet (50 mg total) by mouth 2 (two) times daily as needed.    Dispense:  30 tablet    Refill:  0      Procedures: No procedures performed   Clinical Data: No additional findings.   Subjective: No chief complaint on file.   HPI patient is a pleasant 54 year old female who comes in today following an injury to her right foot and ankle.  On 09/18/2019, she was coming down a set of stairs when she inverted her right ankle.  She has had moderate pain to the dorsum of the foot as well as the anterolateral ankle since.  She describes this as a constant throb worse with bearing weight.  She has been using ice with improvement of swelling.  She has not been taking any over-the-counter pain medication.  Review of Systems as detailed in HPI.  All others reviewed and are negative.   Objective: Vital Signs: Ht 5\' 6"  (1.676 m)   Wt 175 lb (79.4 kg)   BMI 28.25 kg/m   Physical Exam  well-developed well-nourished female no acute distress.  Alert and oriented x3.  Ortho Exam examination of her right foot and ankle shows mild swelling.  No ecchymosis.  She does have moderate tenderness to the dorsum of the foot just over the navicular.  She has marked tenderness to the ATFL as well.  No medial or lateral bony tenderness.  She has increased pain with plantar flexion and inversion of the ankle.  She is neurovascular intact distally.  Specialty Comments:  No specialty comments available.  Imaging: XR Ankle Complete Right  Result Date: 09/25/2019 X-rays demonstrate navicular avulsion fracture    PMFS History: Patient Active Problem List   Diagnosis Date Noted  . IBS (irritable bowel syndrome) 07/27/2017  . Abdominal pain 07/27/2017  . GERD (gastroesophageal reflux disease) 05/02/2017  . Tobacco abuse counseling 04/27/2017  . Obesity (BMI 30.0-34.9) 04/27/2017  . Ingrown toenail 04/27/2017  . Primary osteoarthritis of right knee 04/27/2017  . Essential hypertension 02/19/2017  . Hyperlipidemia LDL goal <70 02/19/2017  . Type 2 diabetes mellitus with complication, without long-term current use of insulin (HCC) 02/19/2017  . Bronchitis 02/19/2017  . Screening for breast cancer 02/19/2017  . Immunization due 02/19/2017  .  Screen for colon cancer 02/19/2017  . Vitamin D deficiency 02/19/2017  . Fatigue 02/19/2017   Past Medical History:  Diagnosis Date  . Abdominal pain 07/27/2017  . AMI (acute myocardial infarction) (HCC) 2006   "mild"  . Bronchitis 02/19/2017  . Diabetes mellitus without complication (HCC)   . Essential hypertension 02/19/2017  . Fatigue 02/19/2017  . GERD (gastroesophageal reflux disease) 05/02/2017  . Hyperlipidemia   . Hyperlipidemia LDL goal <70 02/19/2017  . Hypertension   . IBS (irritable bowel syndrome) 07/27/2017  . Ingrown toenail 04/27/2017  . Irritable bowel syndrome with constipation 02/19/2017  . Obesity (BMI 30.0-34.9) 04/27/2017  .  Primary osteoarthritis of right knee 04/27/2017  . Tobacco abuse counseling 04/27/2017  . Type 2 diabetes mellitus with complication, without long-term current use of insulin (HCC) 02/19/2017  . Vitamin D deficiency 02/19/2017    Family History  Problem Relation Age of Onset  . Diabetes Mother   . Glaucoma Mother   . Hypertension Mother   . Heart attack Mother   . Hypertension Father   . Diabetes Father   . Heart disease Father   . Glaucoma Father   . Throat cancer Father   . Kidney disease Brother   . Diabetes Brother   . Hypertension Brother   . Hypertension Brother   . Diabetes Brother   . Cancer Brother        angiosarcoma  . Colon cancer Neg Hx   . Gastric cancer Neg Hx   . Esophageal cancer Neg Hx     Past Surgical History:  Procedure Laterality Date  . ABDOMINAL HYSTERECTOMY     due to menorrhagia  . APPENDECTOMY    . CESAREAN SECTION     x 4  . CHOLECYSTECTOMY    . JOINT REPLACEMENT     total left knee  . LUNG BIOPSY     Social History   Occupational History  . Not on file  Tobacco Use  . Smoking status: Current Every Day Smoker    Packs/day: 0.25    Years: 34.00    Pack years: 8.50    Types: Cigarettes  . Smokeless tobacco: Never Used  Vaping Use  . Vaping Use: Never used  Substance and Sexual Activity  . Alcohol use: No  . Drug use: No  . Sexual activity: Yes    Birth control/protection: Surgical

## 2019-09-26 ENCOUNTER — Ambulatory Visit: Payer: Medicare Other | Admitting: Physical Therapy

## 2019-09-26 ENCOUNTER — Ambulatory Visit: Payer: Medicare Other | Admitting: Physician Assistant

## 2019-10-19 NOTE — Progress Notes (Signed)
Cardiology Office Note Date:  10/23/2019  Patient ID:  Tasha Aguilar, Tasha Aguilar January 04, 1966, MRN [id number] PCP:  Aliene Beams, MD  Cardiologist/Electrophysiologist: Dr. Elberta Fortis    Chief Complaint: increase in palpitations and dizzy spells  History of Present Illness: Tasha Aguilar is a 54 y.o. female with history of DM, HTN, HLD, IBS, obesity, OA and symptomatic PVCs   She comes in today to be seen for Dr. Elberta Fortis, last seen by him via telehealth visit June 2020.  She had stopped the flecainide given no improvement in her PVCs and had quit smoking as well.  Her PVCs were no longer as bothersome as they had previously been, burden was 1% on a prior monitor.  No changes were made with plans for an an annual visit.  TODAY She is all in all doing OK.  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 She in the last few weeks or months though has developed worsening palpitations, and feel different then what her PVCs over the years have felt like.  These are strong and somewhat painful, sudden lasting a few seconds, making her feel lightheaded wihen they occur.  They are very startling, vert strong and make her areflexively take in a deep breath. They are increasing in frequency and duration both.  No clear pattern though does not seem to notice them at night, more daytime, happening a couple times a week now.  She reports her BP well controlled as long as she does not forget any of her medicines. Her PMD monitors and manages her lipids.  Her father developed CAD in his 37s, had CABG and died early 73's  Past Medical History:  Diagnosis Date  . Abdominal pain 07/27/2017  . AMI (acute myocardial infarction) (HCC) 2006   "mild"  . Bronchitis 02/19/2017  . Diabetes mellitus without complication (HCC)   . Essential hypertension 02/19/2017  . Fatigue 02/19/2017  . GERD (gastroesophageal  reflux disease) 05/02/2017  . Hyperlipidemia   . Hyperlipidemia LDL goal <70 02/19/2017  . Hypertension   . IBS (irritable bowel syndrome) 07/27/2017  . Ingrown toenail 04/27/2017  . Irritable bowel syndrome with constipation 02/19/2017  . Obesity (BMI 30.0-34.9) 04/27/2017  . Primary osteoarthritis of right knee 04/27/2017  . Tobacco abuse counseling 04/27/2017  . Type 2 diabetes mellitus with complication, without long-term current use of insulin (HCC) 02/19/2017  . Vitamin D deficiency 02/19/2017    Past Surgical History:  Procedure Laterality Date  . ABDOMINAL HYSTERECTOMY     due to menorrhagia  . APPENDECTOMY    . CESAREAN SECTION     x 4  . CHOLECYSTECTOMY    . JOINT REPLACEMENT     total left knee  . LUNG BIOPSY      Current Outpatient Medications  Medication Sig Dispense Refill  . atorvastatin (LIPITOR) 80 MG tablet Take 1 tablet (80 mg total) by mouth daily. 90 tablet 0  . buPROPion (WELLBUTRIN XL) 300 MG 24 hr tablet Take 300 mg by mouth daily.    . [facility]S ASPIRIN ADULT LOW DOSE 81 MG chewable tablet Chew 81 mg by mouth daily.    Marland Kitchen lisinopril (PRINIVIL,ZESTRIL) 40 MG tablet Take 1 tablet (40 mg total) by mouth daily. 90 tablet 3  . methocarbamol (ROBAXIN) 500 MG tablet Take 1 tablet (500 mg total) by mouth 2 (two) times daily as needed. 20 tablet 0  .  metoprolol succinate (TOPROL-XL) 50 MG 24 hr tablet Take 1 tablet (50 mg total) by mouth daily. Take with or immediately following a meal. 90 tablet 3  . omeprazole (PRILOSEC) 40 MG capsule TAKE 1 CAPSULE BY MOUTH EVERY DAY 30 capsule 5  . traMADol (ULTRAM) 50 MG tablet Take 1 tablet (50 mg total) by mouth 2 (two) times daily as needed. 30 tablet 0  . triamterene-hydrochlorothiazide (MAXZIDE-25) 37.5-25 MG tablet Take 1 tablet by mouth daily. 90 tablet 3   No current facility-administered medications for this visit.    Allergies:   Latex   Social History:  The patient  reports that she has been smoking cigarettes. She has a  8.50 pack-year smoking history. She has never used smokeless tobacco. She reports that she does not drink alcohol and does not use drugs.   Family History:  The patient's family history includes Cancer in her brother; Diabetes in her brother, brother, father, and mother; Glaucoma in her father and mother; Heart attack in her mother; Heart disease in her father; Hypertension in her brother, brother, father, and mother; Kidney disease in her brother; Throat cancer in her father.  ROS:  Please see the history of present illness.    All other systems are reviewed and otherwise negative.   PHYSICAL EXAM:  VS:  BP 124/72   Pulse 83   Ht 5\' 6"  (1.676 m)   Wt 178 lb (80.7 kg)   SpO2 99%   BMI 28.73 kg/m  BMI: Body mass index is 28.73 kg/m. Well nourished, well developed, in no acute distress HEENT: normocephalic, atraumatic Neck: no JVD, carotid bruits or masses Cardiac:  RRR; no significant murmurs, no rubs, or gallops Lungs:  CTA b/l, no wheezing, rhonchi or rales Abd: soft, nontender MS: no deformity or atrophy Ext: no edema Skin: warm and dry, no rash Neuro:  No gross deficits appreciated Psych: euthymic mood, full affect    EKG:  Done today and reviewed by myself shows  SR 83bpm, inf T changes appear similar to older, lat T flattening nonspecific   Nov 2019, monitor Minimum: 57 bpm (00:17:50 Day 14) Maximum: 129 bpm (13:17:20 Day 7) Average: 73 bpm Rare ventricular and supraventricular ectopy 0% atrial fibrillation burden Intermittent couplets and ventricular runs, maximum 3 beats   10/31/2015: stress myoview Mild anterior wall attenuation artifcat Normal LV wall motion and function LVEF57%   Recent Labs: No results found for requested labs within last 8760 hours.  No results found for requested labs within last 8760 hours.   CrCl cannot be calculated (Patient's most recent lab result is older than the maximum 21 days allowed.).   Wt Readings from Last 3 Encounters:   10/23/19 178 lb (80.7 kg)  09/25/19 175 lb (79.4 kg)  09/11/19 179 lb 3.2 oz (81.3 kg)     Other studies reviewed: Additional studies/records reviewed today include: summarized above  ASSESSMENT AND PLAN:  1. PVCs     She has had a change and escalation in her palpitations as discussed above     They are strong "painful" and associated with dizziness/lightheaded feeling, will have her wear an Zio AT   2. HTN     Looks ok  3. Abn EKG     Marked CV risk     lexiscan stress test (she has a broken foot), and echo    Disposition: F/u with 11/11/19 in 6 weeks, sooner if needed.   Current medicines are reviewed at length with the patient today.  The patient did not have any concerns regarding medicines.  Norma Fredrickson, PA-C 10/23/2019 8:38 AM     [facility] [address] [place] [address] [phone] (office)  [phone] (fax)

## 2019-10-23 ENCOUNTER — Ambulatory Visit (INDEPENDENT_AMBULATORY_CARE_PROVIDER_SITE_OTHER): Payer: Medicare Other | Admitting: Physician Assistant

## 2019-10-23 ENCOUNTER — Encounter: Payer: Self-pay | Admitting: *Deleted

## 2019-10-23 ENCOUNTER — Telehealth: Payer: Self-pay | Admitting: Radiology

## 2019-10-23 ENCOUNTER — Other Ambulatory Visit: Payer: Self-pay

## 2019-10-23 VITALS — BP 124/72 | HR 83 | Ht 66.0 in | Wt 178.0 lb

## 2019-10-23 DIAGNOSIS — R42 Dizziness and giddiness: Secondary | ICD-10-CM | POA: Diagnosis not present

## 2019-10-23 DIAGNOSIS — R9431 Abnormal electrocardiogram [ECG] [EKG]: Secondary | ICD-10-CM | POA: Diagnosis not present

## 2019-10-23 DIAGNOSIS — R079 Chest pain, unspecified: Secondary | ICD-10-CM

## 2019-10-23 DIAGNOSIS — R002 Palpitations: Secondary | ICD-10-CM

## 2019-10-23 DIAGNOSIS — R55 Syncope and collapse: Secondary | ICD-10-CM

## 2019-10-23 DIAGNOSIS — I10 Essential (primary) hypertension: Secondary | ICD-10-CM

## 2019-10-23 NOTE — Telephone Encounter (Signed)
Enrolled patient for a 14 day Zio AT monitor to be mailed to patients home.  

## 2019-10-23 NOTE — Patient Instructions (Addendum)
Medication Instructions:   Your physician recommends that you continue on your current medications as directed. Please refer to the Current Medication list given to you today.  *If you need a refill on your cardiac medications before your next appointment, please call your pharmacy*   Lab Work: NONE ORDERED  TODAY   If you have labs (blood work) drawn today and your tests are completely normal, you will receive your results only by: Marland Kitchen MyChart Message (if you have MyChart) OR . A paper copy in the mail If you have any lab test that is abnormal or we need to change your treatment, we will call you to review the results.   Testing/Procedures: Your physician has requested that you have a lexiscan myoview. For further information please visit [email or web address] Please follow instruction sheet, as given.   Your physician has requested that you have an echocardiogram. Echocardiography is a painless test that uses sound waves to create images of your heart. It provides your doctor with information about the size and shape of your heart and how well your heart's chambers and valves are working. This procedure takes approximately one hour. There are no restrictions for this procedure.  Your physician has recommended that you wear an event monitor. Event monitors are medical devices that record the heart's electrical activity. Doctors most often [place] these monitors to diagnose arrhythmias. Arrhythmias are problems with the speed or rhythm of the heartbeat  (ZIO AT Long term monitor-Live Telemetry ) Your physician has requested you wear a ZIO patch monitor for _14_ days.  This is a single patch monitor. [employer] supplies one patch monitor per enrollment. Additional stickers are not available.  Please do not apply patch if you will be having a Nuclear Stress Test, Echocardiogram, Cardiac CT, MRI, or Chest Xray during the time frame you would be wearing the monitor. The patch cannot be worn during these  tests. You cannot remove and re-apply the ZIO AT patch monitor.   Your ZIO patch monitor will be sent Fed Ex from [employer] directly to your home address. The monitor may also be mailed to a PO BOX if home delivery is not available. It may take 3-5 days to receive your monitor after you have been enrolled.  Once you have received you monitor, please review enclosed instructions. Your monitor has already been registered assigning a specific monitor serial # to you.   Applying the monitor  Shave hair from upper left chest.  Hold abrader disc by orange tab. Rub abrader in 40 strokes over left upper chest as indicated in your monitor instructions.  Clean area with 4 enclosed alcohol pads. Use all pads to ensure the area is cleaned thoroughly. Let dry.  Apply patch as indicated in monitor instructions. Patch will be placed under collarbone on left side of chest with arrow pointing upward.  Rub patch adhesive wings for 2 minutes. Remove the white label marked "1". Remove the white label marked "2". Rub patch adhesive wings for 2 additional minutes.  While looking in a mirror, press and release button in center of patch. A small green light will flash 3-4 times. This will be your only indicator the monitor has been turned on.  Do not shower for the first 24 hours. You may shower after the first 24 hours.  Press the button if you feel a symptom. You will hear a small click. Record Date, Time and Symptom in the Patient Log.   Starting the Gateway  In your kit there  is a small plastic box the size of a cellphone. This is Airline pilot. It transmits all your recorded data to [facility]. This box must stay within 10 feet of you at all times. Open the box and push the * button. There will be a light that blinks orange and then green a few times. When the light stops blinking, the Gateway is connected to the ZIO patch.  Call [employer] at [phone] to confirm your monitor is transmitting.   Returning  your monitor  Remove your patch and place it inside the [place]. In the lower half of the Gateway there is a white bag with prepaid postage on it. Place Gateway in bag and seal. Mail package back to [place] as soon as possible. Your physician should have your final report approximately 7 days after you have mailed back your monitor.   Call [place] at [phone] if you have questions regarding your ZIO AT patch monitor. Call them immediately if you see an orange light blinking on your monitor.  If your monitor falls off in less than 4 days contact our Monitor department at [phone]. If your monitor becomes loose or falls off after 4 days call [employer] at [phone] for suggestions on securing your monitor.      Follow-Up: At [facility], you and your health needs are our priority.  As part of our continuing mission to provide you with exceptional heart care, we have created designated Provider Care Teams.  These Care Teams include your primary Cardiologist (physician) and Advanced Practice Providers (APPs -  Physician Assistants and Nurse Practitioners) who all work together to provide you with the care you need, when you need it.  We recommend signing up for the patient portal called "MyChart".  Sign up information is provided on this After Visit Summary.  MyChart is used to connect with patients for Virtual Visits (Telemedicine).  Patients are able to view lab/test results, encounter notes, upcoming appointments, etc.  Non-urgent messages can be sent to your provider as well.   To learn more about what you can do with MyChart, go to [email or web address].    Your next appointment:   6 week(s)  The format for your next appointment:   In Person  Provider:    You may see Tommye Standard, PA-C     Other Instructions

## 2019-10-25 ENCOUNTER — Ambulatory Visit (INDEPENDENT_AMBULATORY_CARE_PROVIDER_SITE_OTHER): Payer: Medicare Other | Admitting: Orthopaedic Surgery

## 2019-10-25 ENCOUNTER — Other Ambulatory Visit: Payer: Self-pay

## 2019-10-25 ENCOUNTER — Encounter: Payer: Self-pay | Admitting: Orthopaedic Surgery

## 2019-10-25 ENCOUNTER — Ambulatory Visit: Payer: Self-pay

## 2019-10-25 DIAGNOSIS — S93491D Sprain of other ligament of right ankle, subsequent encounter: Secondary | ICD-10-CM

## 2019-10-25 DIAGNOSIS — S92254A Nondisplaced fracture of navicular [scaphoid] of right foot, initial encounter for closed fracture: Secondary | ICD-10-CM

## 2019-10-25 NOTE — Addendum Note (Signed)
Addended by: Oleta Mouse on: 10/25/2019 07:40 AM   Modules accepted: Orders

## 2019-10-25 NOTE — Progress Notes (Signed)
Office Visit Note   Patient: Tasha Aguilar           Date of Birth: March 05, 1965           MRN: [id number] Visit Date: 10/25/2019              Requested by: Aliene Beams, MD [address] [place],  [place] [address] PCP: Aliene Beams, MD   Assessment & Plan: Visit Diagnoses:  1. Closed nondisplaced fracture of navicular bone of right foot, initial encounter   2. Sprain of anterior talofibular ligament of right ankle, subsequent encounter     Plan: Impression is healing right ankle grade 1-2 sprain and right foot navicular avulsion fracture.  At this point, I feel as we should stabilize her foot and ankle a little more in a cam boot for about 3 weeks.  She will wear this with ambulation.  She will continue to rest and ice as needed for pain.  She will follow up with [place] at that point in time for repeat evaluation.  I provided her with a work note for desk work only for another 3 weeks.  Follow-Up Instructions: Return in about 3 weeks (around 11/15/2019).   Orders:  Orders Placed This Encounter  Procedures  . XR Ankle Complete Right   No orders of the defined types were placed in this encounter.     Procedures: No procedures performed   Clinical Data: No additional findings.   Subjective: No chief complaint on file.   HPI patient is a very pleasant 54 year old female who comes in today approximately 5 weeks out right to grade 1-2 ankle sprain and navicular avulsion fracture.  She has been ambulating an ASO which she feels does not provide enough support.  She still has some pain with ambulation,  But overall is doing much better as she has no pain at rest.  She has not returned to work yet as she works on her feet at an [employer] for several hours a day.  She notes that they are unable to provide any light duty or desk work.  Review of Systems as detailed in HPI.  All others reviewed and are negative.  Objective: Vital Signs: There were no vitals taken for  this visit.  Physical Exam well-developed well-nourished female no acute distress.  Alert oriented x3.  Ortho Exam examination of her right foot reveals very mild swelling.  She has moderate tenderness along the ATFL.  She has mild to moderate tenderness to the dorsum of the foot navicular.  She has increased pain with inversion of the ankle.  She is neurovascular intact distally.  Specialty Comments:  No specialty comments available.  Imaging: XR Ankle Complete Right  Result Date: 10/25/2019 X-rays reveal small avulsion to the navicular with evidence of bony consolidation    PMFS History: Patient Active Problem List   Diagnosis Date Noted  . IBS (irritable bowel syndrome) 07/27/2017  . Abdominal pain 07/27/2017  . GERD (gastroesophageal reflux disease) 05/02/2017  . Tobacco abuse counseling 04/27/2017  . Obesity (BMI 30.0-34.9) 04/27/2017  . Ingrown toenail 04/27/2017  . Primary osteoarthritis of right knee 04/27/2017  . Essential hypertension 02/19/2017  . Hyperlipidemia LDL goal <70 02/19/2017  . Type 2 diabetes mellitus with complication, without long-term current use of insulin (HCC) 02/19/2017  . Bronchitis 02/19/2017  . Screening for breast cancer 02/19/2017  . Immunization due 02/19/2017  . Screen for colon cancer 02/19/2017  . Vitamin D deficiency 02/19/2017  . Fatigue  02/19/2017   Past Medical History:  Diagnosis Date  . Abdominal pain 07/27/2017  . AMI (acute myocardial infarction) (HCC) 2006   "mild"  . Bronchitis 02/19/2017  . Diabetes mellitus without complication (HCC)   . Essential hypertension 02/19/2017  . Fatigue 02/19/2017  . GERD (gastroesophageal reflux disease) 05/02/2017  . Hyperlipidemia   . Hyperlipidemia LDL goal <70 02/19/2017  . Hypertension   . IBS (irritable bowel syndrome) 07/27/2017  . Ingrown toenail 04/27/2017  . Irritable bowel syndrome with constipation 02/19/2017  . Obesity (BMI 30.0-34.9) 04/27/2017  . Primary osteoarthritis of right  knee 04/27/2017  . Tobacco abuse counseling 04/27/2017  . Type 2 diabetes mellitus with complication, without long-term current use of insulin (HCC) 02/19/2017  . Vitamin D deficiency 02/19/2017    Family History  Problem Relation Age of Onset  . Diabetes Mother   . Glaucoma Mother   . Hypertension Mother   . Heart attack Mother   . Hypertension Father   . Diabetes Father   . Heart disease Father   . Glaucoma Father   . Throat cancer Father   . Kidney disease Brother   . Diabetes Brother   . Hypertension Brother   . Hypertension Brother   . Diabetes Brother   . Cancer Brother        angiosarcoma  . Colon cancer Neg Hx   . Gastric cancer Neg Hx   . Esophageal cancer Neg Hx     Past Surgical History:  Procedure Laterality Date  . ABDOMINAL HYSTERECTOMY     due to menorrhagia  . APPENDECTOMY    . CESAREAN SECTION     x 4  . CHOLECYSTECTOMY    . JOINT REPLACEMENT     total left knee  . LUNG BIOPSY     Social History   Occupational History  . Not on file  Tobacco Use  . Smoking status: Current Every Day Smoker    Packs/day: 0.25    Years: 34.00    Pack years: 8.50    Types: Cigarettes  . Smokeless tobacco: Never Used  Vaping Use  . Vaping Use: Never used  Substance and Sexual Activity  . Alcohol use: No  . Drug use: No  . Sexual activity: Yes    Birth control/protection: Surgical

## 2019-10-27 ENCOUNTER — Ambulatory Visit (INDEPENDENT_AMBULATORY_CARE_PROVIDER_SITE_OTHER): Payer: Medicare Other

## 2019-10-27 DIAGNOSIS — R002 Palpitations: Secondary | ICD-10-CM | POA: Diagnosis not present

## 2019-10-27 DIAGNOSIS — R42 Dizziness and giddiness: Secondary | ICD-10-CM | POA: Diagnosis not present

## 2019-10-27 DIAGNOSIS — R55 Syncope and collapse: Secondary | ICD-10-CM | POA: Diagnosis not present

## 2019-11-06 ENCOUNTER — Telehealth (HOSPITAL_COMMUNITY): Payer: Self-pay

## 2019-11-06 NOTE — Telephone Encounter (Signed)
Attempted to contact the patient. The mailbox is full, I will send a my chart letter for the patient. S.Mayana Irigoyen EMTP

## 2019-11-13 ENCOUNTER — Encounter (HOSPITAL_COMMUNITY): Payer: Self-pay | Admitting: *Deleted

## 2019-11-13 ENCOUNTER — Ambulatory Visit (HOSPITAL_COMMUNITY): Payer: Medicare Other | Attending: Cardiology

## 2019-11-13 ENCOUNTER — Other Ambulatory Visit: Payer: Self-pay

## 2019-11-13 DIAGNOSIS — R42 Dizziness and giddiness: Secondary | ICD-10-CM | POA: Diagnosis present

## 2019-11-13 DIAGNOSIS — R9431 Abnormal electrocardiogram [ECG] [EKG]: Secondary | ICD-10-CM | POA: Diagnosis present

## 2019-11-13 DIAGNOSIS — R079 Chest pain, unspecified: Secondary | ICD-10-CM | POA: Diagnosis present

## 2019-11-13 DIAGNOSIS — R002 Palpitations: Secondary | ICD-10-CM

## 2019-11-13 LAB — ECHOCARDIOGRAM COMPLETE
Area-P 1/2: 4.19 cm2
Height: 66 in
S' Lateral: 2.5 cm
Weight: 2848 oz

## 2019-11-13 MED ORDER — TECHNETIUM TC 99M TETROFOSMIN IV KIT
10.9000 | PACK | Freq: Once | INTRAVENOUS | Status: AC | PRN
  Administered 2019-11-13: 10.9 via INTRAVENOUS
  Filled 2019-11-13: qty 11

## 2019-11-15 ENCOUNTER — Ambulatory Visit (INDEPENDENT_AMBULATORY_CARE_PROVIDER_SITE_OTHER): Payer: Medicare Other | Admitting: Orthopaedic Surgery

## 2019-11-15 ENCOUNTER — Ambulatory Visit: Payer: Self-pay

## 2019-11-15 ENCOUNTER — Encounter: Payer: Self-pay | Admitting: Orthopaedic Surgery

## 2019-11-15 DIAGNOSIS — S92251D Displaced fracture of navicular [scaphoid] of right foot, subsequent encounter for fracture with routine healing: Secondary | ICD-10-CM

## 2019-11-15 NOTE — Progress Notes (Signed)
Office Visit Note   Patient: Tasha Aguilar           Date of Birth: May 24, 1965           MRN: [id number] Visit Date: 11/15/2019              Requested by: Aliene Beams, MD [address] [place],  [place] [address] PCP: Aliene Beams, MD   Assessment & Plan: Visit Diagnoses:  1. Closed avulsion fracture of navicular bone of right foot with routine healing, subsequent encounter     Plan: Impression is 2 months out right foot grade 1-2 ankle sprain and avulsion fracture of the navicular.  At this point, she will continue to wear her cam walker with ambulation, but will start to wean into her ASO brace as soon as possible.  We will continue her current work note of desk work only for another 3 weeks.  Follow-up with [place] at that point time for recheck and probable release back to work.  Follow-Up Instructions: Return in about 3 weeks (around 12/06/2019).   Orders:  Orders Placed This Encounter  Procedures  . XR Foot Complete Right   No orders of the defined types were placed in this encounter.     Procedures: No procedures performed   Clinical Data: No additional findings.   Subjective: Chief Complaint  Patient presents with  . Right Ankle - Pain  . Right Foot - Pain    HPI patient is a pleasant 54 year old female who comes in today about 2 months out right foot avulsion fracture to the navicular as well as grade 1-2 ankle sprain.  She has been wearing her cam walker and occasionally wearing the ASO brace which seems to significantly help.  Overall, her symptoms have improved, but she still has some pain with bearing weight.  Review of Systems as detailed in HPI.  All others reviewed and are negative.   Objective: Vital Signs: There were no vitals taken for this visit.  Physical Exam well-developed well-nourished female in no acute distress.  Alert oriented x3.  Ortho Exam right foot and ankle exam shows moderate tenderness to the ATFL as well as to the dorsum  of the foot at the navicular.  She has increased pain with inversion of the ankle.  Otherwise, painless range of motion  Specialty Comments:  No specialty comments available.  Imaging: XR Foot Complete Right  Result Date: 11/15/2019 X-rays demonstrate continued consolidation to the navicular fracture    PMFS History: Patient Active Problem List   Diagnosis Date Noted  . IBS (irritable bowel syndrome) 07/27/2017  . Abdominal pain 07/27/2017  . GERD (gastroesophageal reflux disease) 05/02/2017  . Tobacco abuse counseling 04/27/2017  . Obesity (BMI 30.0-34.9) 04/27/2017  . Ingrown toenail 04/27/2017  . Primary osteoarthritis of right knee 04/27/2017  . Essential hypertension 02/19/2017  . Hyperlipidemia LDL goal <70 02/19/2017  . Type 2 diabetes mellitus with complication, without long-term current use of insulin (HCC) 02/19/2017  . Bronchitis 02/19/2017  . Screening for breast cancer 02/19/2017  . Immunization due 02/19/2017  . Screen for colon cancer 02/19/2017  . Vitamin D deficiency 02/19/2017  . Fatigue 02/19/2017   Past Medical History:  Diagnosis Date  . Abdominal pain 07/27/2017  . AMI (acute myocardial infarction) (HCC) 2006   "mild"  . Bronchitis 02/19/2017  . Diabetes mellitus without complication (HCC)   . Essential hypertension 02/19/2017  . Fatigue 02/19/2017  . GERD (gastroesophageal reflux disease) 05/02/2017  . Hyperlipidemia   .  Hyperlipidemia LDL goal <70 02/19/2017  . Hypertension   . IBS (irritable bowel syndrome) 07/27/2017  . Ingrown toenail 04/27/2017  . Irritable bowel syndrome with constipation 02/19/2017  . Obesity (BMI 30.0-34.9) 04/27/2017  . Primary osteoarthritis of right knee 04/27/2017  . Tobacco abuse counseling 04/27/2017  . Type 2 diabetes mellitus with complication, without long-term current use of insulin (HCC) 02/19/2017  . Vitamin D deficiency 02/19/2017    Family History  Problem Relation Age of Onset  . Diabetes Mother   . Glaucoma  Mother   . Hypertension Mother   . Heart attack Mother   . Hypertension Father   . Diabetes Father   . Heart disease Father   . Glaucoma Father   . Throat cancer Father   . Kidney disease Brother   . Diabetes Brother   . Hypertension Brother   . Hypertension Brother   . Diabetes Brother   . Cancer Brother        angiosarcoma  . Colon cancer Neg Hx   . Gastric cancer Neg Hx   . Esophageal cancer Neg Hx     Past Surgical History:  Procedure Laterality Date  . ABDOMINAL HYSTERECTOMY     due to menorrhagia  . APPENDECTOMY    . CESAREAN SECTION     x 4  . CHOLECYSTECTOMY    . JOINT REPLACEMENT     total left knee  . LUNG BIOPSY     Social History   Occupational History  . Not on file  Tobacco Use  . Smoking status: Current Every Day Smoker    Packs/day: 0.25    Years: 34.00    Pack years: 8.50    Types: Cigarettes  . Smokeless tobacco: Never Used  Vaping Use  . Vaping Use: Never used  Substance and Sexual Activity  . Alcohol use: No  . Drug use: No  . Sexual activity: Yes    Birth control/protection: Surgical

## 2019-11-19 ENCOUNTER — Ambulatory Visit (HOSPITAL_COMMUNITY): Payer: Medicare Other

## 2019-11-22 ENCOUNTER — Encounter (HOSPITAL_COMMUNITY): Payer: Self-pay

## 2019-12-06 ENCOUNTER — Ambulatory Visit (INDEPENDENT_AMBULATORY_CARE_PROVIDER_SITE_OTHER): Payer: Medicare Other | Admitting: Orthopaedic Surgery

## 2019-12-06 DIAGNOSIS — S93491D Sprain of other ligament of right ankle, subsequent encounter: Secondary | ICD-10-CM

## 2019-12-06 MED ORDER — TRAMADOL HCL 50 MG PO TABS: 50.0000 mg | ORAL_TABLET | Freq: Four times a day (QID) | ORAL | 0 refills | Status: AC | PRN

## 2019-12-06 NOTE — Progress Notes (Signed)
Office Visit Note   Patient: Tasha Aguilar           Date of Birth: Oct 03, 1965           MRN: [id number] Visit Date: 12/06/2019              Requested by: Aliene Beams, MD [address] [place],  [place] [address] PCP: Aliene Beams, MD   Assessment & Plan: Visit Diagnoses:  1. Sprain of anterior talofibular ligament of right ankle, subsequent encounter     Plan: Impression is healed right ankle sprain and right foot navicular fracture.  The patient may advance with activity as tolerated.  We will limit her weightbearing to no more than 25 pounds for the next 3 weeks and then she may return to full duty.  She will follow up with [place] as needed.  Follow-Up Instructions: Return if symptoms worsen or fail to improve.   Orders:  No orders of the defined types were placed in this encounter.  Meds ordered this encounter  Medications  . traMADol (ULTRAM) 50 MG tablet    Sig: Take 1 tablet (50 mg total) by mouth every 6 (six) hours as needed.    Dispense:  30 tablet    Refill:  0      Procedures: No procedures performed   Clinical Data: No additional findings.   Subjective: Chief Complaint  Patient presents with  . Right Ankle - Pain, Follow-up    HPI patient is a very pleasant 54 year old female who comes in today little under 3 months out right foot grade 1 2 ankle sprain and avulsion fracture of the navicular.  She has been doing well.  She has minimal pain which is relieved with tramadol. She has not returned to work as she works in the [employer].    Objective: Vital Signs: There were no vitals taken for this visit.    Ortho Exam right foot exam shows no swelling.  No bony tenderness.  Near full range of motion.  Specialty Comments:  No specialty comments available.  Imaging: No new imaging   PMFS History: Patient Active Problem List   Diagnosis Date Noted  . IBS (irritable bowel syndrome) 07/27/2017  . Abdominal pain 07/27/2017  . GERD  (gastroesophageal reflux disease) 05/02/2017  . Tobacco abuse counseling 04/27/2017  . Obesity (BMI 30.0-34.9) 04/27/2017  . Ingrown toenail 04/27/2017  . Primary osteoarthritis of right knee 04/27/2017  . Essential hypertension 02/19/2017  . Hyperlipidemia LDL goal <70 02/19/2017  . Type 2 diabetes mellitus with complication, without long-term current use of insulin (HCC) 02/19/2017  . Bronchitis 02/19/2017  . Screening for breast cancer 02/19/2017  . Immunization due 02/19/2017  . Screen for colon cancer 02/19/2017  . Vitamin D deficiency 02/19/2017  . Fatigue 02/19/2017   Past Medical History:  Diagnosis Date  . Abdominal pain 07/27/2017  . AMI (acute myocardial infarction) (HCC) 2006   "mild"  . Bronchitis 02/19/2017  . Diabetes mellitus without complication (HCC)   . Essential hypertension 02/19/2017  . Fatigue 02/19/2017  . GERD (gastroesophageal reflux disease) 05/02/2017  . Hyperlipidemia   . Hyperlipidemia LDL goal <70 02/19/2017  . Hypertension   . IBS (irritable bowel syndrome) 07/27/2017  . Ingrown toenail 04/27/2017  . Irritable bowel syndrome with constipation 02/19/2017  . Obesity (BMI 30.0-34.9) 04/27/2017  . Primary osteoarthritis of right knee 04/27/2017  . Tobacco abuse counseling 04/27/2017  . Type 2 diabetes mellitus with complication, without long-term current use of insulin (  HCC) 02/19/2017  . Vitamin D deficiency 02/19/2017    Family History  Problem Relation Age of Onset  . Diabetes Mother   . Glaucoma Mother   . Hypertension Mother   . Heart attack Mother   . Hypertension Father   . Diabetes Father   . Heart disease Father   . Glaucoma Father   . Throat cancer Father   . Kidney disease Brother   . Diabetes Brother   . Hypertension Brother   . Hypertension Brother   . Diabetes Brother   . Cancer Brother        angiosarcoma  . Colon cancer Neg Hx   . Gastric cancer Neg Hx   . Esophageal cancer Neg Hx     Past Surgical History:  Procedure  Laterality Date  . ABDOMINAL HYSTERECTOMY     due to menorrhagia  . APPENDECTOMY    . CESAREAN SECTION     x 4  . CHOLECYSTECTOMY    . JOINT REPLACEMENT     total left knee  . LUNG BIOPSY     Social History   Occupational History  . Not on file  Tobacco Use  . Smoking status: Current Every Day Smoker    Packs/day: 0.25    Years: 34.00    Pack years: 8.50    Types: Cigarettes  . Smokeless tobacco: Never Used  Vaping Use  . Vaping Use: Never used  Substance and Sexual Activity  . Alcohol use: No  . Drug use: No  . Sexual activity: Yes    Birth control/protection: Surgical

## 2019-12-09 NOTE — Progress Notes (Deleted)
Cardiology Office Note Date:  12/09/2019  Patient ID:  Tasha, Aguilar 1965/01/22, MRN [id number] PCP:  Aliene Beams, MD  Cardiologist/Electrophysiologist: Dr. Elberta Fortis    Chief Complaint: *** f/u test results, symptoms  History of Present Illness: Tasha Aguilar is a 54 y.o. female with history of DM, HTN, HLD, IBS, obesity, OA and symptomatic PVCs   She comes in today to be seen for Dr. Elberta Fortis, last seen by him via telehealth visit June 2020.  She had stopped the flecainide given no improvement in her PVCs and had quit smoking as well.  Her PVCs were no longer as bothersome as they had previously been, burden was 1% on a prior monitor.  No changes were made with plans for an an annual visit.  I saw her 10/23/2019 She is all in all doing OK.  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 She in the last few weeks or months though has developed worsening palpitations, and feel different then what her PVCs over the years have felt like.  These are strong and somewhat painful, sudden lasting a few seconds, making her feel lightheaded wihen they occur.  They are very startling, vert strong and make her areflexively take in a deep breath. They are increasing in frequency and duration both.  No clear pattern though does not seem to notice them at night, more daytime, happening a couple times a week now. She reports her BP well controlled as long as she does not forget any of her medicines. Her PMD monitors and manages her lipids. Her father developed CAD in his 24s, had CABG and died early 16's [name] was abnormal (noted below), given CV risk planned for stress test and echo, as well as ZIo AT given her change in palpitations and dizziness.  LVEF 65-70%, no WMA< mod conc LVH, normal diastolic parameters. Her stress test was incomplete, she apparently did not stay/come back for  the stress imaging Monitor  With <1% PVCs, no abnormal rhythms, Symptoms associated with sinus rhythm and sinus tachycardia   *** symptoms *** stress test *** BP control, LVH  Past Medical History:  Diagnosis Date  . Abdominal pain 07/27/2017  . AMI (acute myocardial infarction) (HCC) 2006   "mild"  . Bronchitis 02/19/2017  . Diabetes mellitus without complication (HCC)   . Essential hypertension 02/19/2017  . Fatigue 02/19/2017  . GERD (gastroesophageal reflux disease) 05/02/2017  . Hyperlipidemia   . Hyperlipidemia LDL goal <70 02/19/2017  . Hypertension   . IBS (irritable bowel syndrome) 07/27/2017  . Ingrown toenail 04/27/2017  . Irritable bowel syndrome with constipation 02/19/2017  . Obesity (BMI 30.0-34.9) 04/27/2017  . Primary osteoarthritis of right knee 04/27/2017  . Tobacco abuse counseling 04/27/2017  . Type 2 diabetes mellitus with complication, without long-term current use of insulin (HCC) 02/19/2017  . Vitamin D deficiency 02/19/2017    Past Surgical History:  Procedure Laterality Date  . ABDOMINAL HYSTERECTOMY     due to menorrhagia  . APPENDECTOMY    . CESAREAN SECTION     x 4  . CHOLECYSTECTOMY    . JOINT REPLACEMENT     total left knee  . LUNG BIOPSY      Current Outpatient Medications  Medication Sig Dispense Refill  . atorvastatin (LIPITOR) 80 MG tablet Take 1 tablet (80 mg total) by mouth daily. 90 tablet 0  . buPROPion (  WELLBUTRIN XL) 300 MG 24 hr tablet Take 300 mg by mouth daily.    . [facility] ASPIRIN ADULT LOW DOSE 81 MG chewable tablet Chew 81 mg by mouth daily.    Marland Kitchen lisinopril (PRINIVIL,ZESTRIL) 40 MG tablet Take 1 tablet (40 mg total) by mouth daily. 90 tablet 3  . methocarbamol (ROBAXIN) 500 MG tablet Take 1 tablet (500 mg total) by mouth 2 (two) times daily as needed. 20 tablet 0  . metoprolol succinate (TOPROL-XL) 50 MG 24 hr tablet Take 1 tablet (50 mg total) by mouth daily. Take with or immediately following a meal. 90 tablet 3  . omeprazole  (PRILOSEC) 40 MG capsule TAKE 1 CAPSULE BY MOUTH EVERY DAY 30 capsule 5  . traMADol (ULTRAM) 50 MG tablet Take 1 tablet (50 mg total) by mouth every 6 (six) hours as needed. 30 tablet 0  . triamterene-hydrochlorothiazide (MAXZIDE-25) 37.5-25 MG tablet Take 1 tablet by mouth daily. 90 tablet 3   No current facility-administered medications for this visit.    Allergies:   Latex   Social History:  The patient  reports that she has been smoking cigarettes. She has a 8.50 pack-year smoking history. She has never used smokeless tobacco. She reports that she does not drink alcohol and does not use drugs.   Family History:  The patient's family history includes Cancer in her brother; Diabetes in her brother, brother, father, and mother; Glaucoma in her father and mother; Heart attack in her mother; Heart disease in her father; Hypertension in her brother, brother, father, and mother; Kidney disease in her brother; Throat cancer in her father.  ROS:  Please see the history of present illness.    All other systems are reviewed and otherwise negative.   PHYSICAL EXAM:  VS:  There were no vitals taken for this visit. BMI: There is no height or weight on file to calculate BMI. Well nourished, well developed, in no acute distress HEENT: normocephalic, atraumatic Neck: no JVD, carotid bruits or masses Cardiac:  *** RRR; no significant murmurs, no rubs, or gallops Lungs:  *** CTA b/l, no wheezing, rhonchi or rales Abd: soft, nontender MS: no deformity or atrophy Ext: *** no edema Skin: warm and dry, no rash Neuro:  No gross deficits appreciated Psych: euthymic mood, full affect    EKG:   10/23/2019: SR 83bpm, inf T changes appear similar to older, lat T flattening nonspecific    Nov 2021, Zio AT Max 150 bpm 01:22am, 10/31 Min 41 bpm 03:11am, 11/05 Avg 90 bpm <1% PVCs and supraventricular ectopy Predominant rhythm was sinus rhythm Symptoms associated with sinus rhythm and sinus  tachycardia    11/23/2019: stress  Resting images showed a mid anteroseptal defect.  The patient did not show up for stress imaging.  Nondiagnostic study due to no stress images performed.      11/13/2019: TTE 1. Left ventricular ejection fraction, by estimation, is 65 to 70%. The  left ventricle has normal function. The left ventricle has no regional  wall motion abnormalities. There is moderate concentric left ventricular  hypertrophy. Left ventricular  diastolic parameters were normal.  2. Right ventricular systolic function is normal. The right ventricular  size is normal.  3. The mitral valve is normal in structure. Trivial mitral valve  regurgitation. No evidence of mitral stenosis.  4. The aortic valve is normal in structure. Aortic valve regurgitation is  not visualized. Mild to moderate aortic valve sclerosis/calcification is  present, without any evidence of aortic stenosis.  5. The inferior vena cava is normal in size with greater than 50%  respiratory variability, suggesting right atrial pressure of 3 mmHg.    Nov 2019, monitor Minimum: 57 bpm (00:17:50 Day 14) Maximum: 129 bpm (13:17:20 Day 7) Average: 73 bpm Rare ventricular and supraventricular ectopy 0% atrial fibrillation burden Intermittent couplets and ventricular runs, maximum 3 beats   10/31/2015: stress myoview Mild anterior wall attenuation artifcat Normal LV wall motion and function LVEF57%   Recent Labs: No results found for requested labs within last 8760 hours.  No results found for requested labs within last 8760 hours.   CrCl cannot be calculated (Patient's most recent lab result is older than the maximum 21 days allowed.).   Wt Readings from Last 3 Encounters:  11/13/19 178 lb (80.7 kg)  10/23/19 178 lb (80.7 kg)  09/25/19 175 lb (79.4 kg)     Other studies reviewed: Additional studies/records reviewed today include: summarized above  ASSESSMENT AND PLAN:  1. PVCs      ***  2. HTN     *** Looks ok  3. Abn EKG     ***   Disposition: ***   Current medicines are reviewed at length with the patient today.  The patient did not have any concerns regarding medicines.  Norma Fredrickson, PA-C 12/09/2019 2:06 PM     [facility] [address] [place] [address] [phone] (office)  [phone] (fax)

## 2019-12-11 ENCOUNTER — Ambulatory Visit: Payer: Medicare Other | Admitting: Physician Assistant

## 2019-12-25 ENCOUNTER — Ambulatory Visit: Payer: Medicare Other | Admitting: Orthopaedic Surgery

## 2020-01-02 ENCOUNTER — Other Ambulatory Visit: Payer: Self-pay | Admitting: *Deleted

## 2020-01-02 DIAGNOSIS — F17210 Nicotine dependence, cigarettes, uncomplicated: Secondary | ICD-10-CM

## 2020-01-02 DIAGNOSIS — Z87891 Personal history of nicotine dependence: Secondary | ICD-10-CM

## 2020-01-07 DIAGNOSIS — E1140 Type 2 diabetes mellitus with diabetic neuropathy, unspecified: Secondary | ICD-10-CM | POA: Diagnosis not present

## 2020-01-07 DIAGNOSIS — I10 Essential (primary) hypertension: Secondary | ICD-10-CM | POA: Diagnosis not present

## 2020-01-07 DIAGNOSIS — E785 Hyperlipidemia, unspecified: Secondary | ICD-10-CM | POA: Diagnosis not present

## 2020-02-12 NOTE — Progress Notes (Deleted)
Shared Decision Making Visit Lung Cancer Screening Program [phone])   Eligibility:  Age 55 y.o.  Pack Years Smoking History Calculation *** (# packs/per year x # years smoked)  Recent History of coughing up blood  {YES NO:22349}  Unexplained weight loss? {YES NO:22349} ( >Than 15 pounds within the last 6 months )  Prior History Lung / other cancer {YES NO:22349} (Diagnosis within the last 5 years already requiring surveillance chest CT Scans).  Smoking Status {Smoking Status:21012044}  Former Smokers: Years since quit: {Smoking numbers:21012046}  Quit Date: ***  Visit Components:  Discussion included one or more decision making aids. {YES NO:22349}  Discussion included risk/benefits of screening. {YES NO:22349}  Discussion included potential follow up diagnostic testing for abnormal scans. {YES NO:22349}  Discussion included meaning and risk of over diagnosis. {YES NO:22349}  Discussion included meaning and risk of False Positives. {YES NO:22349}  Discussion included meaning of total radiation exposure. {YES [id number]  Counseling Included:  Importance of adherence to annual lung cancer LDCT screening. {YES NO:22349}  Impact of comorbidities on ability to participate in the program. {YES NO:22349}  Ability and willingness to under diagnostic treatment. {YES NO:22349}  Smoking Cessation Counseling:  Current Smokers:   Discussed importance of smoking cessation. {YES [id number]  Information about tobacco cessation classes and interventions provided to patient. {YES [id number]  Patient provided with "ticket" for LDCT Scan. {YES NO:22349}  Symptomatic Patient. {YES NO:22349}  Counseling{Symptomatic Patient:21012041}  Diagnosis Code: Tobacco Use Z72.0  Asymptomatic Patient {YES NO:22349}  Counseling {Asymptomatic patient:21012042}  Former Smokers:   Discussed the importance of maintaining cigarette abstinence. {YES NO:22349}  Diagnosis Code: Personal History  of Nicotine Dependence. U04.540  Information about tobacco cessation classes and interventions provided to patient. {Responses; yes/no/refused:32142}  Patient provided with "ticket" for LDCT Scan. {YES [id number]  Written Order for Lung Cancer Screening with LDCT placed in Epic. {Smoking cessesion custom:21012043} (CT Chest Lung Cancer Screening Low Dose W/O CM) [id number] Z12.2-Screening of respiratory organs Z87.891-Personal history of nicotine dependence   Bevelyn Ngo, NP

## 2020-02-13 ENCOUNTER — Encounter: Payer: Medicare Other | Admitting: Acute Care

## 2020-02-13 ENCOUNTER — Inpatient Hospital Stay: Admission: RE | Admit: 2020-02-13 | Payer: Medicare Other | Source: Ambulatory Visit

## 2020-03-17 ENCOUNTER — Ambulatory Visit: Payer: Medicare Other | Admitting: Physician Assistant

## 2020-04-09 DIAGNOSIS — E1140 Type 2 diabetes mellitus with diabetic neuropathy, unspecified: Secondary | ICD-10-CM | POA: Diagnosis not present

## 2020-04-09 DIAGNOSIS — N649 Disorder of breast, unspecified: Secondary | ICD-10-CM | POA: Diagnosis not present

## 2020-04-09 DIAGNOSIS — E785 Hyperlipidemia, unspecified: Secondary | ICD-10-CM | POA: Diagnosis not present

## 2020-04-09 DIAGNOSIS — Z7984 Long term (current) use of oral hypoglycemic drugs: Secondary | ICD-10-CM | POA: Diagnosis not present

## 2020-04-09 DIAGNOSIS — Z720 Tobacco use: Secondary | ICD-10-CM | POA: Diagnosis not present

## 2020-04-09 DIAGNOSIS — I10 Essential (primary) hypertension: Secondary | ICD-10-CM | POA: Diagnosis not present

## 2020-04-09 DIAGNOSIS — Z122 Encounter for screening for malignant neoplasm of respiratory organs: Secondary | ICD-10-CM | POA: Diagnosis not present

## 2020-04-09 DIAGNOSIS — S93431S Sprain of tibiofibular ligament of right ankle, sequela: Secondary | ICD-10-CM | POA: Diagnosis not present

## 2020-04-10 ENCOUNTER — Other Ambulatory Visit (HOSPITAL_COMMUNITY): Payer: Self-pay | Admitting: Family Medicine

## 2020-04-10 DIAGNOSIS — N63 Unspecified lump in unspecified breast: Secondary | ICD-10-CM

## 2020-04-14 ENCOUNTER — Telehealth: Payer: Self-pay | Admitting: *Deleted

## 2020-04-14 DIAGNOSIS — F17210 Nicotine dependence, cigarettes, uncomplicated: Secondary | ICD-10-CM

## 2020-04-14 DIAGNOSIS — Z87891 Personal history of nicotine dependence: Secondary | ICD-10-CM

## 2020-04-14 NOTE — Telephone Encounter (Signed)
Spoke with pt and scheduled SDMV 05/21/20 11:00 CT ordered Nothing further needed

## 2020-04-17 DIAGNOSIS — M5416 Radiculopathy, lumbar region: Secondary | ICD-10-CM | POA: Diagnosis not present

## 2020-04-17 DIAGNOSIS — M4316 Spondylolisthesis, lumbar region: Secondary | ICD-10-CM | POA: Diagnosis not present

## 2020-04-17 DIAGNOSIS — M545 Low back pain, unspecified: Secondary | ICD-10-CM | POA: Diagnosis not present

## 2020-04-20 NOTE — Progress Notes (Deleted)
Cardiology Office Note Date:  04/20/2020  Patient ID:  Tasha Aguilar, DOB 24-Jul-1965, MRN [id number] PCP:  Aliene Beams, MD  Cardiologist/Electrophysiologist: Dr. Elberta Fortis    Chief Complaint: *** ???  History of Present Illness: Tasha Aguilar is a 55 y.o. female with history of DM, HTN, HLD, IBS, obesity, OA and symptomatic PVCs   She comes in today to be seen for Dr. Elberta Fortis, last seen by him via telehealth visit June 2020.  She had stopped the flecainide given no improvement in her PVCs and had quit smoking as well.  Her PVCs were no longer as bothersome as they had previously been, burden was 1% on a prior monitor.  No changes were made with plans for an an annual visit.  I saw her 11/27/19 She is all in all doing OK.  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 She in the last few weeks or months though has developed worsening palpitations, and feel different then what her PVCs over the years have felt like.  These are strong and somewhat painful, sudden lasting a few seconds, making her feel lightheaded wihen they occur.  They are very startling, vert strong and make her areflexively take in a deep breath. They are increasing in frequency and duration both.  No clear pattern though does not seem to notice them at night, more daytime, happening a couple times a week now.  She reports her BP well controlled as long as she does not forget any of her medicines. Her PMD monitors and manages her lipids. Her father developed CAD in his 63s, had CABG and died early 95's  She was planned for monitoring and a stress test Monitor noted <1% PVCs her stress test incomplete, she did not come to the 2nd day, there was a rest defect mid anteroseptal.  She had an ER visit 11/29/20 "Patient with history of high blood pressure multiple medications and cocaine abuse came to the  hospital with a low blood pressure. Patient admitted to taking multiple blood pressure medications doubling the doses because she had some cocaine. Blood pressure was in the 80s systolic and patient was symptomatic. She was aggressively hydrated. She was found to have acute renal failure with a creatinine of 2.5. Blood pressure improved and was restarted on amlodipine. Was advised not to take lisinopril or triamterene hydrochlorothiazide again"  *** symptoms *** meds *** cocaine ** labs, AKI  Past Medical History:  Diagnosis Date  . Abdominal pain 07/27/2017  . AMI (acute myocardial infarction) (HCC) 2006   "mild"  . Bronchitis 02/19/2017  . Diabetes mellitus without complication (HCC)   . Essential hypertension 02/19/2017  . Fatigue 02/19/2017  . GERD (gastroesophageal reflux disease) 05/02/2017  . Hyperlipidemia   . Hyperlipidemia LDL goal <70 02/19/2017  . Hypertension   . IBS (irritable bowel syndrome) 07/27/2017  . Ingrown toenail 04/27/2017  . Irritable bowel syndrome with constipation 02/19/2017  . Obesity (BMI 30.0-34.9) 04/27/2017  . Primary osteoarthritis of right knee 04/27/2017  . Tobacco abuse counseling 04/27/2017  . Type 2 diabetes mellitus with complication, without long-term current use of insulin (HCC) 02/19/2017  . Vitamin D deficiency 02/19/2017    Past Surgical History:  Procedure Laterality Date  . ABDOMINAL HYSTERECTOMY     due to menorrhagia  . APPENDECTOMY    . CESAREAN SECTION     x 4  . CHOLECYSTECTOMY    .  JOINT REPLACEMENT     total left knee  . LUNG BIOPSY      Current Outpatient Medications  Medication Sig Dispense Refill  . atorvastatin (LIPITOR) 80 MG tablet Take 1 tablet (80 mg total) by mouth daily. 90 tablet 0  . buPROPion (WELLBUTRIN XL) 300 MG 24 hr tablet Take 300 mg by mouth daily.    . [facility]S ASPIRIN ADULT LOW DOSE 81 MG chewable tablet Chew 81 mg by mouth daily.    Marland Kitchen lisinopril (PRINIVIL,ZESTRIL) 40 MG tablet Take 1 tablet (40 mg total) by  mouth daily. 90 tablet 3  . methocarbamol (ROBAXIN) 500 MG tablet Take 1 tablet (500 mg total) by mouth 2 (two) times daily as needed. 20 tablet 0  . metoprolol succinate (TOPROL-XL) 50 MG 24 hr tablet Take 1 tablet (50 mg total) by mouth daily. Take with or immediately following a meal. 90 tablet 3  . omeprazole (PRILOSEC) 40 MG capsule TAKE 1 CAPSULE BY MOUTH EVERY DAY 30 capsule 5  . traMADol (ULTRAM) 50 MG tablet Take 1 tablet (50 mg total) by mouth every 6 (six) hours as needed. 30 tablet 0  . triamterene-hydrochlorothiazide (MAXZIDE-25) 37.5-25 MG tablet Take 1 tablet by mouth daily. 90 tablet 3   No current facility-administered medications for this visit.    Allergies:   Latex   Social History:  The patient  reports that she has been smoking cigarettes. She has a 8.50 pack-year smoking history. She has never used smokeless tobacco. She reports that she does not drink alcohol and does not use drugs.   Family History:  The patient's family history includes Cancer in her brother; Diabetes in her brother, brother, father, and mother; Glaucoma in her father and mother; Heart attack in her mother; Heart disease in her father; Hypertension in her brother, brother, father, and mother; Kidney disease in her brother; Throat cancer in her father.  ROS:  Please see the history of present illness.    All other systems are reviewed and otherwise negative.   PHYSICAL EXAM:  VS:  There were no vitals taken for this visit. BMI: There is no height or weight on file to calculate BMI. Well nourished, well developed, in no acute distress HEENT: normocephalic, atraumatic Neck: no JVD, carotid bruits or masses Cardiac:  *** RRR; no significant murmurs, no rubs, or gallops Lungs:  *** CTA b/l, no wheezing, rhonchi or rales Abd: soft, nontender MS: no deformity or atrophy Ext: *** no edema Skin: warm and dry, no rash Neuro:  No gross deficits appreciated Psych: euthymic mood, full affect    EKG:   Not done today   Nov 2021: Monitor Max 150 bpm 01:22am, 10/31 Min 41 bpm 03:11am, 11/05 Avg 90 bpm <1% PVCs and supraventricular ectopy Predominant rhythm was sinus rhythm Symptoms associated with sinus rhythm and sinus tachycardia   11/23/2019: Stress test (incomplete)  Resting images showed a mid anteroseptal defect.  The patient did not show up for stress imaging.  Nondiagnostic study due to no stress images performed.   Nov 2019, monitor Minimum: 57 bpm (00:17:50 Day 14) Maximum: 129 bpm (13:17:20 Day 7) Average: 73 bpm Rare ventricular and supraventricular ectopy 0% atrial fibrillation burden Intermittent couplets and ventricular runs, maximum 3 beats   10/31/2015: stress myoview Mild anterior wall attenuation artifcat Normal LV wall motion and function LVEF57%   Recent Labs: No results found for requested labs within last 8760 hours.  No results found for requested labs within last 8760 hours.  CrCl cannot be calculated (Patient's most recent lab result is older than the maximum 21 days allowed.).   Wt Readings from Last 3 Encounters:  11/13/19 178 lb (80.7 kg)  10/23/19 178 lb (80.7 kg)  09/25/19 175 lb (79.4 kg)     Other studies reviewed: Additional studies/records reviewed today include: summarized above  ASSESSMENT AND PLAN:  1. PVCs   ***  2. HTN     *** Looks ok  3. Abn EKG     ***    Disposition: ***   Current medicines are reviewed at length with the patient today.  The patient did not have any concerns regarding medicines.  Norma Fredrickson, PA-C 04/20/2020 7:35 AM     [facility] [address] [place] [address] [phone] (office)  [phone] (fax)

## 2020-04-21 ENCOUNTER — Ambulatory Visit: Payer: Medicare Other | Admitting: Physician Assistant

## 2020-04-21 NOTE — Progress Notes (Deleted)
Cardiology Office Note Date:  04/21/2020  Patient ID:  Tasha Aguilar, DOB [birth year], MRN [id number] PCP:  Aliene Beams, MD  Cardiologist/Electrophysiologist: Dr. Elberta Fortis    Chief Complaint: *** ???  History of Present Illness: Tasha Aguilar is a 55 y.o. female with history of DM, HTN, HLD, IBS, obesity, OA and symptomatic PVCs   She comes in today to be seen for Dr. Elberta Fortis, last seen by him via telehealth visit June 2020.  She had stopped the flecainide given no improvement in her PVCs and had quit smoking as well.  Her PVCs were no longer as bothersome as they had previously been, burden was 1% on a prior monitor.  No changes were made with plans for an an annual visit.  I saw her 11/27/19 She is all in all doing OK.  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 She in the last few weeks or months though has developed worsening palpitations, and feel different then what her PVCs over the years have felt like.  These are strong and somewhat painful, sudden lasting a few seconds, making her feel lightheaded wihen they occur.  They are very startling, vert strong and make her areflexively take in a deep breath. They are increasing in frequency and duration both.  No clear pattern though does not seem to notice them at night, more daytime, happening a couple times a week now.  She reports her BP well controlled as long as she does not forget any of her medicines. Her PMD monitors and manages her lipids. Her father developed CAD in his 43s, had CABG and died early 87's  She was planned for monitoring and a stress test Monitor noted <1% PVCs her stress test incomplete, she did not come to the 2nd day, there was a rest defect mid anteroseptal.  She had an ER visit 11/29/20 "Patient with history of high blood pressure multiple medications and cocaine abuse came to the  hospital with a low blood pressure. Patient admitted to taking multiple blood pressure medications doubling the doses because she had some cocaine. Blood pressure was in the 80s systolic and patient was symptomatic. She was aggressively hydrated. She was found to have acute renal failure with a creatinine of 2.5. Blood pressure improved and was restarted on amlodipine. Was advised not to take lisinopril or triamterene hydrochlorothiazide again"  *** symptoms *** meds *** cocaine ** labs, AKI  Past Medical History:  Diagnosis Date  . Abdominal pain 07/27/2017  . AMI (acute myocardial infarction) (HCC) 2006   "mild"  . Bronchitis 02/19/2017  . Diabetes mellitus without complication (HCC)   . Essential hypertension 02/19/2017  . Fatigue 02/19/2017  . GERD (gastroesophageal reflux disease) 05/02/2017  . Hyperlipidemia   . Hyperlipidemia LDL goal <70 02/19/2017  . Hypertension   . IBS (irritable bowel syndrome) 07/27/2017  . Ingrown toenail 04/27/2017  . Irritable bowel syndrome with constipation 02/19/2017  . Obesity (BMI 30.0-34.9) 04/27/2017  . Primary osteoarthritis of right knee 04/27/2017  . Tobacco abuse counseling 04/27/2017  . Type 2 diabetes mellitus with complication, without long-term current use of insulin (HCC) 02/19/2017  . Vitamin D deficiency 02/19/2017    Past Surgical History:  Procedure Laterality Date  . ABDOMINAL HYSTERECTOMY     due to menorrhagia  . APPENDECTOMY    . CESAREAN SECTION     x 4  . CHOLECYSTECTOMY    .  JOINT REPLACEMENT     total left knee  . LUNG BIOPSY      Current Outpatient Medications  Medication Sig Dispense Refill  . atorvastatin (LIPITOR) 80 MG tablet Take 1 tablet (80 mg total) by mouth daily. 90 tablet 0  . buPROPion (WELLBUTRIN XL) 300 MG 24 hr tablet Take 300 mg by mouth daily.    . [facility] ASPIRIN ADULT LOW DOSE 81 MG chewable tablet Chew 81 mg by mouth daily.    Marland Kitchen lisinopril (PRINIVIL,ZESTRIL) 40 MG tablet Take 1 tablet (40 mg total) by  mouth daily. 90 tablet 3  . methocarbamol (ROBAXIN) 500 MG tablet Take 1 tablet (500 mg total) by mouth 2 (two) times daily as needed. 20 tablet 0  . metoprolol succinate (TOPROL-XL) 50 MG 24 hr tablet Take 1 tablet (50 mg total) by mouth daily. Take with or immediately following a meal. 90 tablet 3  . omeprazole (PRILOSEC) 40 MG capsule TAKE 1 CAPSULE BY MOUTH EVERY DAY 30 capsule 5  . traMADol (ULTRAM) 50 MG tablet Take 1 tablet (50 mg total) by mouth every 6 (six) hours as needed. 30 tablet 0  . triamterene-hydrochlorothiazide (MAXZIDE-25) 37.5-25 MG tablet Take 1 tablet by mouth daily. 90 tablet 3   No current facility-administered medications for this visit.    Allergies:   Latex   Social History:  The patient  reports that she has been smoking cigarettes. She has a 8.50 pack-year smoking history. She has never used smokeless tobacco. She reports that she does not drink alcohol and does not use drugs.   Family History:  The patient's family history includes Cancer in her brother; Diabetes in her brother, brother, father, and mother; Glaucoma in her father and mother; Heart attack in her mother; Heart disease in her father; Hypertension in her brother, brother, father, and mother; Kidney disease in her brother; Throat cancer in her father.  ROS:  Please see the history of present illness.    All other systems are reviewed and otherwise negative.   PHYSICAL EXAM:  VS:  There were no vitals taken for this visit. BMI: There is no height or weight on file to calculate BMI. Well nourished, well developed, in no acute distress HEENT: normocephalic, atraumatic Neck: no JVD, carotid bruits or masses Cardiac:  *** RRR; no significant murmurs, no rubs, or gallops Lungs:  *** CTA b/l, no wheezing, rhonchi or rales Abd: soft, nontender MS: no deformity or atrophy Ext: *** no edema Skin: warm and dry, no rash Neuro:  No gross deficits appreciated Psych: euthymic mood, full affect    EKG:   Not done today   Nov 2021: Monitor Max 150 bpm 01:22am, 10/31 Min 41 bpm 03:11am, 11/05 Avg 90 bpm <1% PVCs and supraventricular ectopy Predominant rhythm was sinus rhythm Symptoms associated with sinus rhythm and sinus tachycardia   11/23/2019: Stress test (incomplete)  Resting images showed a mid anteroseptal defect.  The patient did not show up for stress imaging.  Nondiagnostic study due to no stress images performed.   Nov 2019, monitor Minimum: 57 bpm (00:17:50 Day 14) Maximum: 129 bpm (13:17:20 Day 7) Average: 73 bpm Rare ventricular and supraventricular ectopy 0% atrial fibrillation burden Intermittent couplets and ventricular runs, maximum 3 beats   10/31/2015: stress myoview Mild anterior wall attenuation artifcat Normal LV wall motion and function LVEF57%   Recent Labs: No results found for requested labs within last 8760 hours.  No results found for requested labs within last 8760 hours.  CrCl cannot be calculated (Patient's most recent lab result is older than the maximum 21 days allowed.).   Wt Readings from Last 3 Encounters:  11/13/19 178 lb (80.7 kg)  10/23/19 178 lb (80.7 kg)  09/25/19 175 lb (79.4 kg)     Other studies reviewed: Additional studies/records reviewed today include: summarized above  ASSESSMENT AND PLAN:  1. PVCs   ***  2. HTN     *** Looks ok  3. Abn EKG     ***    Disposition: ***   Current medicines are reviewed at length with the patient today.  The patient did not have any concerns regarding medicines.  Norma Fredrickson, PA-C 04/21/2020 6:45 PM     [facility] [address] [place] [address] [phone] (office)  [phone] (fax)

## 2020-04-23 ENCOUNTER — Ambulatory Visit: Payer: Medicare Other | Admitting: Physician Assistant

## 2020-04-29 ENCOUNTER — Other Ambulatory Visit (HOSPITAL_COMMUNITY): Payer: Self-pay | Admitting: Family Medicine

## 2020-05-01 ENCOUNTER — Other Ambulatory Visit (HOSPITAL_COMMUNITY): Payer: Self-pay | Admitting: Family Medicine

## 2020-05-01 DIAGNOSIS — R921 Mammographic calcification found on diagnostic imaging of breast: Secondary | ICD-10-CM

## 2020-05-06 ENCOUNTER — Ambulatory Visit (HOSPITAL_COMMUNITY)
Admission: RE | Admit: 2020-05-06 | Discharge: 2020-05-06 | Disposition: A | Discharge status: Home / Self Care | Payer: Medicare Other | Source: Ambulatory Visit | Attending: Family Medicine | Admitting: Family Medicine

## 2020-05-06 ENCOUNTER — Other Ambulatory Visit: Payer: Self-pay

## 2020-05-06 ENCOUNTER — Other Ambulatory Visit (HOSPITAL_COMMUNITY): Payer: Self-pay | Admitting: Family Medicine

## 2020-05-06 ENCOUNTER — Other Ambulatory Visit (HOSPITAL_COMMUNITY): Payer: Medicare Other

## 2020-05-06 ENCOUNTER — Encounter (HOSPITAL_COMMUNITY): Payer: Self-pay

## 2020-05-06 DIAGNOSIS — N63 Unspecified lump in unspecified breast: Secondary | ICD-10-CM

## 2020-05-06 DIAGNOSIS — R921 Mammographic calcification found on diagnostic imaging of breast: Secondary | ICD-10-CM | POA: Insufficient documentation

## 2020-05-06 DIAGNOSIS — N6311 Unspecified lump in the right breast, upper outer quadrant: Secondary | ICD-10-CM | POA: Insufficient documentation

## 2020-05-06 DIAGNOSIS — R922 Inconclusive mammogram: Secondary | ICD-10-CM | POA: Diagnosis not present

## 2020-05-06 DIAGNOSIS — R928 Other abnormal and inconclusive findings on diagnostic imaging of breast: Secondary | ICD-10-CM

## 2020-05-08 ENCOUNTER — Other Ambulatory Visit: Payer: Self-pay

## 2020-05-08 ENCOUNTER — Ambulatory Visit (HOSPITAL_COMMUNITY)
Admission: RE | Admit: 2020-05-08 | Discharge: 2020-05-08 | Disposition: A | Discharge status: Home / Self Care | Payer: Medicare Other | Source: Ambulatory Visit | Attending: Family Medicine | Admitting: Family Medicine

## 2020-05-08 ENCOUNTER — Other Ambulatory Visit (HOSPITAL_COMMUNITY): Payer: Self-pay | Admitting: Family Medicine

## 2020-05-08 ENCOUNTER — Encounter (HOSPITAL_COMMUNITY): Payer: Self-pay

## 2020-05-08 DIAGNOSIS — R928 Other abnormal and inconclusive findings on diagnostic imaging of breast: Secondary | ICD-10-CM

## 2020-05-08 DIAGNOSIS — N6311 Unspecified lump in the right breast, upper outer quadrant: Secondary | ICD-10-CM | POA: Diagnosis not present

## 2020-05-08 DIAGNOSIS — N6011 Diffuse cystic mastopathy of right breast: Secondary | ICD-10-CM | POA: Diagnosis not present

## 2020-05-08 DIAGNOSIS — N6081 Other benign mammary dysplasias of right breast: Secondary | ICD-10-CM | POA: Diagnosis not present

## 2020-05-08 MED ORDER — LIDOCAINE HCL (PF) 2 % IJ SOLN
10.0000 mL | Freq: Once | INTRAMUSCULAR | Status: AC
  Administered 2020-05-08: 10 mL

## 2020-05-08 MED ORDER — LIDOCAINE-EPINEPHRINE (PF) 1 %-1:200000 IJ SOLN
10.0000 mL | Freq: Once | INTRAMUSCULAR | Status: AC
  Administered 2020-05-08: 10 mL via INTRADERMAL

## 2020-05-08 MED ORDER — LIDOCAINE-EPINEPHRINE 0.5 %-1:200000 IJ SOLN
10.0000 mL | Freq: Once | INTRAMUSCULAR | Status: DC
  Filled 2020-05-08: qty 10

## 2020-05-08 NOTE — Discharge Instructions (Signed)
Breast Biopsy, Care After °These instructions give you information about caring for yourself after your procedure. Your doctor may also give you more specific instructions. Call your doctor if you have any problems or questions after your procedure. °What can I expect after the procedure? °After your procedure, it is common to have: °· Bruising on your breast. °· Numbness, tingling, or pain near your biopsy site. °Follow these instructions at home: °Medicines °· Take over-the-counter and prescription medicines only as told by your doctor. °· Do not drive for 24 hours if you were given a medicine to help you relax (sedative) during your procedure. °· Do not drink alcohol while taking pain medicine. °· Do not drive or use heavy machinery while taking prescription pain medicine. °Biopsy site care °· Follow instructions from your doctor about how to take care of your cut from surgery (incision) or your puncture area. Make sure you: °? Wash your hands with soap and water before you change your bandage (dressing). If you cannot use soap and water, use hand sanitizer. °? Change your bandage as told by your doctor. °? Leave stitches (sutures), skin glue, or skin tape (adhesive strips) in place. They may need to stay in place for 2 weeks or longer. If tape strips get loose and curl up, you may trim the loose edges. Do not remove tape strips completely unless your doctor says it is okay. °· If you have stitches, keep them dry when you take a bath or a shower. °· Check your cut or puncture area every day for signs of infection. Check for: °? Redness, swelling, or pain. °? Fluid or blood. °? Warmth. °? Pus or a bad smell. °· Protect the biopsy area. Do not let the area get bumped.  °  °  °Activity °· If you had a cut during your procedure, avoid activities that could pull your cut open. These include: °? Stretching. °? Reaching over your head. °? Exercise. °? Sports. °? Lifting anything that weighs more than 3 lb (1.4  kg). °· Return to your normal activities as told by your doctor. Ask your doctor what activities are safe for you. °Managing pain, stiffness, and swelling °If told, put ice on the biopsy site to relieve swelling: °· Put ice in a plastic bag. °· Place a towel between your skin and the bag. °· Leave the ice on for 20 minutes, 2-3 times a day. °General instructions °· Continue your normal diet. °· Wear a good support bra for as long as told by your doctor. °· Get checked for extra fluid around your lymph nodes (lymphedema) as often as told by your doctor. °· Keep all follow-up visits as told by your doctor. This is important. °Contact a doctor if: °· You notice any of the following at the biopsy site: °? More redness, swelling, or pain. °? More fluid or blood coming from the site. °? The site feels warm to the touch. °? Pus or a bad smell coming from the site. °? The site breaks open after the stitches or skin tape strips have been removed. °· You have a rash. °· You have a fever. °Get help right away if: °· You have more bleeding from the biopsy site. Get help right away if bleeding is more than a small spot. °· You have trouble breathing. °· You have red streaks around the biopsy site. °Summary °· After your procedure, it is common to have bruising, numbness, tingling, or pain near the biopsy site. °· Do not   drive or use heavy machinery while taking prescription pain medicine. °· Wear a good support bra for as long as told by your doctor. °· If you had a cut during your procedure, avoid activities that may pull the cut open. Ask your doctor what activities are safe for you. °This information is not intended to replace advice given to you by your health care provider. Make sure you discuss any questions you have with your health care provider. °Document Revised: 09/03/2019 Document Reviewed: 06/09/2017 °Elsevier Patient Education © 2021 [employer] ° °

## 2020-05-08 NOTE — Sedation Documentation (Signed)
PT tolerated right breast biopsy well today with NAD noted. PT verbalized understanding of discharge instructions. PT ambulated back to the mammogram area this time with an ice pack and printed instructions.

## 2020-05-12 LAB — SURGICAL PATHOLOGY

## 2020-05-20 DIAGNOSIS — M5416 Radiculopathy, lumbar region: Secondary | ICD-10-CM | POA: Diagnosis not present

## 2020-05-20 DIAGNOSIS — M4316 Spondylolisthesis, lumbar region: Secondary | ICD-10-CM | POA: Diagnosis not present

## 2020-05-21 ENCOUNTER — Ambulatory Visit (HOSPITAL_COMMUNITY)
Admission: RE | Admit: 2020-05-21 | Discharge: 2020-05-21 | Disposition: A | Discharge status: Home / Self Care | Payer: Medicare Other | Source: Ambulatory Visit | Attending: Acute Care | Admitting: Acute Care

## 2020-05-21 ENCOUNTER — Other Ambulatory Visit: Payer: Self-pay

## 2020-05-21 ENCOUNTER — Ambulatory Visit (INDEPENDENT_AMBULATORY_CARE_PROVIDER_SITE_OTHER): Payer: Medicare Other | Admitting: Acute Care

## 2020-05-21 ENCOUNTER — Encounter: Payer: Self-pay | Admitting: Acute Care

## 2020-05-21 DIAGNOSIS — Z87891 Personal history of nicotine dependence: Secondary | ICD-10-CM | POA: Diagnosis not present

## 2020-05-21 DIAGNOSIS — F17210 Nicotine dependence, cigarettes, uncomplicated: Secondary | ICD-10-CM | POA: Insufficient documentation

## 2020-05-21 NOTE — Patient Instructions (Signed)
Thank you for participating in the [facility]. It was our pleasure to meet you today. We will call you with the results of your scan within the next few days. Your scan will be assigned a Lung RADS category score by the physicians reading the scans.  This Lung RADS score determines follow up scanning.  See below for description of categories, and follow up screening recommendations. We will be in touch to schedule your follow up screening annually or based on recommendations of our providers. We will fax a copy of your scan results to your Primary Care Physician, or the physician who referred you to the program, to ensure they have the results. Please call the office if you have any questions or concerns regarding your scanning experience or results.  Our office number is [phone]. Please speak with Denise Phelps, RN. She is our Lung Cancer Screening RN. If she is unavailable when you call, please have the office staff send her a message. She will return your call at her earliest convenience. Remember, if your scan is normal, we will scan you annually as long as you continue to meet the criteria for the program. (Age 55-77, Current smoker or smoker who has quit within the last 15 years). If you are a smoker, remember, quitting is the single most powerful action that you can take to decrease your risk of lung cancer and other pulmonary, breathing related problems. We know quitting is hard, and we are here to help.  Please let us know if there is anything we can do to help you meet your goal of quitting. If you are a former smoker, congratulations. We are proud of you! Remain smoke free! Remember you can refer friends or family members through the number above.  We will screen them to make sure they meet criteria for the program. Thank you for helping us take better care of you by participating in Lung Screening.  Lung RADS Categories:  Lung RADS 1: no nodules  or definitely non-concerning nodules.  Recommendation is for a repeat annual scan in 12 months.  Lung RADS 2:  nodules that are non-concerning in appearance and behavior with a very low likelihood of becoming an active cancer. Recommendation is for a repeat annual scan in 12 months.  Lung RADS 3: nodules that are probably non-concerning , includes nodules with a low likelihood of becoming an active cancer.  Recommendation is for a 6-month repeat screening scan. Often noted after an upper respiratory illness. We will be in touch to make sure you have no questions, and to schedule your 6-month scan.  Lung RADS 4 A: nodules with concerning findings, recommendation is most often for a follow up scan in 3 months or additional testing based on our provider's assessment of the scan. We will be in touch to make sure you have no questions and to schedule the recommended 3 month follow up scan.  Lung RADS 4 B:  indicates findings that are concerning. We will be in touch with you to schedule additional diagnostic testing based on our provider's  assessment of the scan.   

## 2020-05-21 NOTE — Progress Notes (Signed)
Virtual Visit via Video Note  I connected with Tasha Aguilar on 05/21/20 at 11:00 AM EDT by a video enabled telemedicine application and verified that I am speaking with the correct person using two identifiers.  Location: Patient: At home Provider: [address], [place], [place], [address]    I discussed the limitations of evaluation and management by telemedicine and the availability of in person appointments. The patient expressed understanding and agreed to proceed.   Shared Decision Making Visit Lung Cancer Screening Program [phone])   Eligibility:  Age 55 y.o.  Pack Years Smoking History Calculation 27 pack year smoking history (# packs/per year x # years smoked)  Recent History of coughing up blood  no  Unexplained weight loss? no ( >Than 15 pounds within the last 6 months )  Prior History Lung / other cancer no (Diagnosis within the last 5 years already requiring surveillance chest CT Scans).  Smoking Status Current Smoker  Former Smokers: Years since quit: NA  Quit Date: NA  Visit Components:  Discussion included one or more decision making aids. yes  Discussion included risk/benefits of screening. yes  Discussion included potential follow up diagnostic testing for abnormal scans. yes  Discussion included meaning and risk of over diagnosis. yes  Discussion included meaning and risk of False Positives. yes  Discussion included meaning of total radiation exposure. yes  Counseling Included:  Importance of adherence to annual lung cancer LDCT screening. yes  Impact of comorbidities on ability to participate in the program. yes  Ability and willingness to under diagnostic treatment. yes  Smoking Cessation Counseling:  Current Smokers:   Discussed importance of smoking cessation. yes  Information about tobacco cessation classes and interventions provided to patient. yes  Patient provided with "ticket" for LDCT Scan. yes  Symptomatic  Patient. no  Counseling  Diagnosis Code: Tobacco Use Z72.0  Asymptomatic Patient yes  Counseling (Intermediate counseling: > three minutes counseling) [id number]  Former Smokers:   Discussed the importance of maintaining cigarette abstinence. yes  Diagnosis Code: Personal History of Nicotine Dependence. D53.299  Information about tobacco cessation classes and interventions provided to patient. Yes  Patient provided with "ticket" for LDCT Scan. yes  Written Order for Lung Cancer Screening with LDCT placed in Epic. Yes (CT Chest Lung Cancer Screening Low Dose W/O CM) [id number] Z12.2-Screening of respiratory organs Z87.891-Personal history of nicotine dependence  I have spent 25 minutes of face to face time with Ms. Perfecto discussing the risks and benefits of lung cancer screening. We viewed a power point together that explained in detail the above noted topics. We paused at intervals to allow for questions to be asked and answered to ensure understanding.We discussed that the single most powerful action that she can take to decrease her risk of developing lung cancer is to quit smoking. We discussed whether or not she is ready to commit to setting a quit date. We discussed options for tools to aid in quitting smoking including nicotine replacement therapy, non-nicotine medications, support groups, Quit Smart classes, and behavior modification. We discussed that often times setting smaller, more achievable goals, such as eliminating 1 cigarette a day for a week and then 2 cigarettes a day for a week can be helpful in slowly decreasing the number of cigarettes smoked. This allows for a sense of accomplishment as well as providing a clinical benefit. I gave her the " Be Stronger Than Your Excuses" card with contact information for community resources, classes, free nicotine replacement therapy, and  access to mobile apps, text messaging, and on-line smoking cessation help. I have also given her my card and  contact information in the event she needs to contact me. We discussed the time and location of the scan, and that either Abigail Miyamoto RN or I will call with the results within 24-48 hours of receiving them. I have offered her  a copy of the power point we viewed  as a resource in the event they need reinforcement of the concepts we discussed today in the office. The patient verbalized understanding of all of  the above and had no further questions upon leaving the office. They have my contact information in the event they have any further questions.  I spent 3-4 minutes counseling on smoking cessation and the health risks of continued tobacco abuse.  I explained to the patient that there has been a high incidence of coronary artery disease noted on these exams. I explained that this is a non-gated exam therefore degree or severity cannot be determined. This patient is currently on statin therapy. I have asked the patient to follow-up with their PCP regarding any incidental finding of coronary artery disease and management with diet or medication as their PCP  feels is clinically indicated. The patient verbalized understanding of the above and had no further questions upon completion of the visit.      Bevelyn Ngo, NP 05/21/2020

## 2020-05-23 NOTE — Progress Notes (Signed)

## 2020-05-30 ENCOUNTER — Encounter: Payer: Self-pay | Admitting: *Deleted

## 2020-05-30 DIAGNOSIS — F17210 Nicotine dependence, cigarettes, uncomplicated: Secondary | ICD-10-CM

## 2020-05-30 DIAGNOSIS — Z87891 Personal history of nicotine dependence: Secondary | ICD-10-CM

## 2020-06-06 DIAGNOSIS — R0602 Shortness of breath: Secondary | ICD-10-CM | POA: Diagnosis not present

## 2020-06-06 DIAGNOSIS — E119 Type 2 diabetes mellitus without complications: Secondary | ICD-10-CM | POA: Diagnosis not present

## 2020-06-06 DIAGNOSIS — F17210 Nicotine dependence, cigarettes, uncomplicated: Secondary | ICD-10-CM | POA: Diagnosis not present

## 2020-06-06 DIAGNOSIS — Z91040 Latex allergy status: Secondary | ICD-10-CM | POA: Diagnosis not present

## 2020-06-06 DIAGNOSIS — R0689 Other abnormalities of breathing: Secondary | ICD-10-CM | POA: Diagnosis not present

## 2020-06-06 DIAGNOSIS — I10 Essential (primary) hypertension: Secondary | ICD-10-CM | POA: Diagnosis not present

## 2020-06-06 DIAGNOSIS — R0789 Other chest pain: Secondary | ICD-10-CM | POA: Diagnosis not present

## 2020-06-06 DIAGNOSIS — R7989 Other specified abnormal findings of blood chemistry: Secondary | ICD-10-CM | POA: Diagnosis not present

## 2020-06-06 DIAGNOSIS — R079 Chest pain, unspecified: Secondary | ICD-10-CM | POA: Diagnosis not present

## 2020-06-06 DIAGNOSIS — R000 Tachycardia, unspecified: Secondary | ICD-10-CM | POA: Diagnosis not present

## 2020-06-17 ENCOUNTER — Encounter (INDEPENDENT_AMBULATORY_CARE_PROVIDER_SITE_OTHER): Payer: Self-pay

## 2020-07-10 DIAGNOSIS — K439 Ventral hernia without obstruction or gangrene: Secondary | ICD-10-CM | POA: Diagnosis not present

## 2020-07-10 DIAGNOSIS — I440 Atrioventricular block, first degree: Secondary | ICD-10-CM | POA: Diagnosis not present

## 2020-07-10 DIAGNOSIS — K859 Acute pancreatitis without necrosis or infection, unspecified: Secondary | ICD-10-CM | POA: Diagnosis not present

## 2020-07-10 DIAGNOSIS — R6889 Other general symptoms and signs: Secondary | ICD-10-CM | POA: Diagnosis not present

## 2020-07-10 DIAGNOSIS — R112 Nausea with vomiting, unspecified: Secondary | ICD-10-CM | POA: Diagnosis not present

## 2020-07-10 DIAGNOSIS — E785 Hyperlipidemia, unspecified: Secondary | ICD-10-CM | POA: Diagnosis not present

## 2020-07-10 DIAGNOSIS — R748 Abnormal levels of other serum enzymes: Secondary | ICD-10-CM | POA: Diagnosis not present

## 2020-07-10 DIAGNOSIS — Z79899 Other long term (current) drug therapy: Secondary | ICD-10-CM | POA: Diagnosis not present

## 2020-07-10 DIAGNOSIS — E119 Type 2 diabetes mellitus without complications: Secondary | ICD-10-CM | POA: Diagnosis not present

## 2020-07-10 DIAGNOSIS — I700 Atherosclerosis of aorta: Secondary | ICD-10-CM | POA: Diagnosis not present

## 2020-07-10 DIAGNOSIS — N179 Acute kidney failure, unspecified: Secondary | ICD-10-CM | POA: Diagnosis not present

## 2020-07-10 DIAGNOSIS — I10 Essential (primary) hypertension: Secondary | ICD-10-CM | POA: Diagnosis not present

## 2020-07-10 DIAGNOSIS — Z743 Need for continuous supervision: Secondary | ICD-10-CM | POA: Diagnosis not present

## 2020-07-10 DIAGNOSIS — K5730 Diverticulosis of large intestine without perforation or abscess without bleeding: Secondary | ICD-10-CM | POA: Diagnosis not present

## 2020-07-10 DIAGNOSIS — F17210 Nicotine dependence, cigarettes, uncomplicated: Secondary | ICD-10-CM | POA: Diagnosis not present

## 2020-07-10 DIAGNOSIS — Z7984 Long term (current) use of oral hypoglycemic drugs: Secondary | ICD-10-CM | POA: Diagnosis not present

## 2020-07-10 DIAGNOSIS — Z20822 Contact with and (suspected) exposure to covid-19: Secondary | ICD-10-CM | POA: Diagnosis not present

## 2020-07-10 DIAGNOSIS — R933 Abnormal findings on diagnostic imaging of other parts of digestive tract: Secondary | ICD-10-CM | POA: Diagnosis not present

## 2020-07-10 DIAGNOSIS — E7800 Pure hypercholesterolemia, unspecified: Secondary | ICD-10-CM | POA: Diagnosis not present

## 2020-07-10 DIAGNOSIS — R9431 Abnormal electrocardiogram [ECG] [EKG]: Secondary | ICD-10-CM | POA: Diagnosis not present

## 2020-07-10 DIAGNOSIS — R609 Edema, unspecified: Secondary | ICD-10-CM | POA: Diagnosis not present

## 2020-07-10 DIAGNOSIS — R110 Nausea: Secondary | ICD-10-CM | POA: Diagnosis not present

## 2020-07-10 DIAGNOSIS — Z794 Long term (current) use of insulin: Secondary | ICD-10-CM | POA: Diagnosis not present

## 2020-07-10 DIAGNOSIS — E869 Volume depletion, unspecified: Secondary | ICD-10-CM | POA: Diagnosis not present

## 2020-07-10 DIAGNOSIS — R109 Unspecified abdominal pain: Secondary | ICD-10-CM | POA: Diagnosis not present

## 2020-07-10 DIAGNOSIS — K529 Noninfective gastroenteritis and colitis, unspecified: Secondary | ICD-10-CM | POA: Diagnosis not present

## 2020-07-10 DIAGNOSIS — Z91040 Latex allergy status: Secondary | ICD-10-CM | POA: Diagnosis not present

## 2020-07-10 DIAGNOSIS — R1111 Vomiting without nausea: Secondary | ICD-10-CM | POA: Diagnosis not present

## 2020-07-10 DIAGNOSIS — I959 Hypotension, unspecified: Secondary | ICD-10-CM | POA: Diagnosis not present

## 2020-07-10 DIAGNOSIS — R197 Diarrhea, unspecified: Secondary | ICD-10-CM | POA: Diagnosis not present

## 2020-07-10 DIAGNOSIS — R1110 Vomiting, unspecified: Secondary | ICD-10-CM | POA: Diagnosis not present

## 2020-07-11 DIAGNOSIS — E785 Hyperlipidemia, unspecified: Secondary | ICD-10-CM | POA: Diagnosis not present

## 2020-07-11 DIAGNOSIS — K529 Noninfective gastroenteritis and colitis, unspecified: Secondary | ICD-10-CM | POA: Diagnosis not present

## 2020-07-11 DIAGNOSIS — I10 Essential (primary) hypertension: Secondary | ICD-10-CM | POA: Diagnosis not present

## 2020-07-11 DIAGNOSIS — E119 Type 2 diabetes mellitus without complications: Secondary | ICD-10-CM | POA: Diagnosis not present

## 2020-07-11 DIAGNOSIS — K439 Ventral hernia without obstruction or gangrene: Secondary | ICD-10-CM | POA: Diagnosis not present

## 2020-07-11 DIAGNOSIS — Z79899 Other long term (current) drug therapy: Secondary | ICD-10-CM | POA: Diagnosis not present

## 2020-07-11 DIAGNOSIS — K859 Acute pancreatitis without necrosis or infection, unspecified: Secondary | ICD-10-CM | POA: Diagnosis not present

## 2020-07-12 DIAGNOSIS — E785 Hyperlipidemia, unspecified: Secondary | ICD-10-CM | POA: Diagnosis not present

## 2020-07-12 DIAGNOSIS — Z79899 Other long term (current) drug therapy: Secondary | ICD-10-CM | POA: Diagnosis not present

## 2020-07-12 DIAGNOSIS — I10 Essential (primary) hypertension: Secondary | ICD-10-CM | POA: Diagnosis not present

## 2020-07-12 DIAGNOSIS — E119 Type 2 diabetes mellitus without complications: Secondary | ICD-10-CM | POA: Diagnosis not present

## 2020-07-12 DIAGNOSIS — R748 Abnormal levels of other serum enzymes: Secondary | ICD-10-CM | POA: Diagnosis not present

## 2020-07-12 DIAGNOSIS — K859 Acute pancreatitis without necrosis or infection, unspecified: Secondary | ICD-10-CM | POA: Diagnosis not present

## 2020-08-13 DIAGNOSIS — Z9049 Acquired absence of other specified parts of digestive tract: Secondary | ICD-10-CM | POA: Diagnosis not present

## 2020-08-13 DIAGNOSIS — R10816 Epigastric abdominal tenderness: Secondary | ICD-10-CM | POA: Diagnosis not present

## 2020-08-13 DIAGNOSIS — K279 Peptic ulcer, site unspecified, unspecified as acute or chronic, without hemorrhage or perforation: Secondary | ICD-10-CM | POA: Diagnosis not present

## 2020-08-13 DIAGNOSIS — Z743 Need for continuous supervision: Secondary | ICD-10-CM | POA: Diagnosis not present

## 2020-08-13 DIAGNOSIS — R112 Nausea with vomiting, unspecified: Secondary | ICD-10-CM | POA: Diagnosis not present

## 2020-08-13 DIAGNOSIS — Z79899 Other long term (current) drug therapy: Secondary | ICD-10-CM | POA: Diagnosis not present

## 2020-08-13 DIAGNOSIS — R6889 Other general symptoms and signs: Secondary | ICD-10-CM | POA: Diagnosis not present

## 2020-08-13 DIAGNOSIS — R197 Diarrhea, unspecified: Secondary | ICD-10-CM | POA: Diagnosis not present

## 2020-08-13 DIAGNOSIS — R109 Unspecified abdominal pain: Secondary | ICD-10-CM | POA: Diagnosis not present

## 2020-08-13 DIAGNOSIS — E7800 Pure hypercholesterolemia, unspecified: Secondary | ICD-10-CM | POA: Diagnosis not present

## 2020-08-13 DIAGNOSIS — R1013 Epigastric pain: Secondary | ICD-10-CM | POA: Diagnosis not present

## 2020-08-13 DIAGNOSIS — I10 Essential (primary) hypertension: Secondary | ICD-10-CM | POA: Diagnosis not present

## 2020-08-13 DIAGNOSIS — R0902 Hypoxemia: Secondary | ICD-10-CM | POA: Diagnosis not present

## 2020-08-13 DIAGNOSIS — K6389 Other specified diseases of intestine: Secondary | ICD-10-CM | POA: Diagnosis not present

## 2020-08-13 DIAGNOSIS — R1010 Upper abdominal pain, unspecified: Secondary | ICD-10-CM | POA: Diagnosis not present

## 2020-08-13 DIAGNOSIS — Z96652 Presence of left artificial knee joint: Secondary | ICD-10-CM | POA: Diagnosis not present

## 2020-08-13 DIAGNOSIS — Z7984 Long term (current) use of oral hypoglycemic drugs: Secondary | ICD-10-CM | POA: Diagnosis not present

## 2020-08-13 DIAGNOSIS — I700 Atherosclerosis of aorta: Secondary | ICD-10-CM | POA: Diagnosis not present

## 2020-08-13 DIAGNOSIS — E119 Type 2 diabetes mellitus without complications: Secondary | ICD-10-CM | POA: Diagnosis not present

## 2020-08-13 DIAGNOSIS — Z7982 Long term (current) use of aspirin: Secondary | ICD-10-CM | POA: Diagnosis not present

## 2020-08-13 DIAGNOSIS — I499 Cardiac arrhythmia, unspecified: Secondary | ICD-10-CM | POA: Diagnosis not present

## 2020-08-13 DIAGNOSIS — K3189 Other diseases of stomach and duodenum: Secondary | ICD-10-CM | POA: Diagnosis not present

## 2020-08-21 ENCOUNTER — Emergency Department (HOSPITAL_BASED_OUTPATIENT_CLINIC_OR_DEPARTMENT_OTHER): Payer: Medicare Other

## 2020-08-21 ENCOUNTER — Emergency Department (HOSPITAL_BASED_OUTPATIENT_CLINIC_OR_DEPARTMENT_OTHER)
Admission: EM | Admit: 2020-08-21 | Discharge: 2020-08-21 | Disposition: A | Discharge status: Home / Self Care | Payer: Medicare Other | Attending: Emergency Medicine | Admitting: Emergency Medicine

## 2020-08-21 ENCOUNTER — Other Ambulatory Visit: Payer: Self-pay

## 2020-08-21 DIAGNOSIS — E119 Type 2 diabetes mellitus without complications: Secondary | ICD-10-CM | POA: Diagnosis not present

## 2020-08-21 DIAGNOSIS — Z79899 Other long term (current) drug therapy: Secondary | ICD-10-CM | POA: Diagnosis not present

## 2020-08-21 DIAGNOSIS — Z77098 Contact with and (suspected) exposure to other hazardous, chiefly nonmedicinal, chemicals: Secondary | ICD-10-CM

## 2020-08-21 DIAGNOSIS — R0789 Other chest pain: Secondary | ICD-10-CM | POA: Diagnosis not present

## 2020-08-21 DIAGNOSIS — R0602 Shortness of breath: Secondary | ICD-10-CM | POA: Diagnosis not present

## 2020-08-21 DIAGNOSIS — R110 Nausea: Secondary | ICD-10-CM | POA: Diagnosis not present

## 2020-08-21 DIAGNOSIS — Z91040 Latex allergy status: Secondary | ICD-10-CM | POA: Diagnosis not present

## 2020-08-21 DIAGNOSIS — I10 Essential (primary) hypertension: Secondary | ICD-10-CM | POA: Insufficient documentation

## 2020-08-21 DIAGNOSIS — F17210 Nicotine dependence, cigarettes, uncomplicated: Secondary | ICD-10-CM | POA: Diagnosis not present

## 2020-08-21 DIAGNOSIS — Z743 Need for continuous supervision: Secondary | ICD-10-CM | POA: Diagnosis not present

## 2020-08-21 DIAGNOSIS — R519 Headache, unspecified: Secondary | ICD-10-CM | POA: Diagnosis not present

## 2020-08-21 MED ORDER — PREDNISONE 20 MG PO TABS: 40.0000 mg | ORAL_TABLET | Freq: Every day | ORAL | 0 refills | Status: AC

## 2020-08-21 MED ORDER — PREDNISONE 20 MG PO TABS
40.0000 mg | ORAL_TABLET | Freq: Once | ORAL | Status: AC
  Administered 2020-08-21: 40 mg via ORAL
  Filled 2020-08-21: qty 2

## 2020-08-21 NOTE — Discharge Instructions (Addendum)
Chest x-ray negative for infection.  Overall suspect some irritation of the lungs from chemical exposure.  Believe this will improve with time but have prescribed steroids to help with irritation.  Take next dose of steroids tomorrow as you have already been given a dose for today.

## 2020-08-21 NOTE — ED Provider Notes (Signed)
[facility] EMERGENCY DEPARTMENT Provider Note   CSN: [id number] Arrival date & time: 08/21/20  1902     History Chief Complaint  Patient presents with   Chemical Exposure    Tasha Aguilar is a 55 y.o. female.  The history is provided by the patient.  Shortness of Breath Severity:  Mild Onset quality:  Gradual Duration:  3 days Timing:  Intermittent Progression:  Waxing and waning Chronicity:  New Context: occupational exposure (sob/irritation from spray at work)   Relieved by:  Nothing Worsened by:  Nothing Associated symptoms: no abdominal pain, no chest pain, no claudication, no cough, no ear pain, no fever, no headaches, no hemoptysis, no neck pain, no PND, no rash, no sore throat, no sputum production, no syncope, no swollen glands, no vomiting and no wheezing       Past Medical History:  Diagnosis Date   Abdominal pain 07/27/2017   AMI (acute myocardial infarction) (HCC) 2006   "mild"   Bronchitis 02/19/2017   Diabetes mellitus without complication (HCC)    Essential hypertension 02/19/2017   Fatigue 02/19/2017   GERD (gastroesophageal reflux disease) 05/02/2017   Hyperlipidemia    Hyperlipidemia LDL goal <70 02/19/2017   Hypertension    IBS (irritable bowel syndrome) 07/27/2017   Ingrown toenail 04/27/2017   Irritable bowel syndrome with constipation 02/19/2017   Obesity (BMI 30.0-34.9) 04/27/2017   Primary osteoarthritis of right knee 04/27/2017   Tobacco abuse counseling 04/27/2017   Type 2 diabetes mellitus with complication, without long-term current use of insulin (HCC) 02/19/2017   Vitamin D deficiency 02/19/2017    Patient Active Problem List   Diagnosis Date Noted   IBS (irritable bowel syndrome) 07/27/2017   Abdominal pain 07/27/2017   GERD (gastroesophageal reflux disease) 05/02/2017   Tobacco abuse counseling 04/27/2017   Obesity (BMI 30.0-34.9) 04/27/2017   Ingrown toenail 04/27/2017   Primary osteoarthritis of right knee 04/27/2017    Essential hypertension 02/19/2017   Hyperlipidemia LDL goal <70 02/19/2017   Type 2 diabetes mellitus with complication, without long-term current use of insulin (HCC) 02/19/2017   Bronchitis 02/19/2017   Screening for breast cancer 02/19/2017   Immunization due 02/19/2017   Screen for colon cancer 02/19/2017   Vitamin D deficiency 02/19/2017   Fatigue 02/19/2017    Past Surgical History:  Procedure Laterality Date   ABDOMINAL HYSTERECTOMY     due to menorrhagia   APPENDECTOMY     CESAREAN SECTION     x 4   CHOLECYSTECTOMY     JOINT REPLACEMENT     total left knee   LUNG BIOPSY       OB History   No obstetric history on file.     Family History  Problem Relation Age of Onset   Diabetes Mother    Glaucoma Mother    Hypertension Mother    Heart attack Mother    Hypertension Father    Diabetes Father    Heart disease Father    Glaucoma Father    Throat cancer Father    Kidney disease Brother    Diabetes Brother    Hypertension Brother    Hypertension Brother    Diabetes Brother    Cancer Brother        angiosarcoma   Colon cancer Neg Hx    Gastric cancer Neg Hx    Esophageal cancer Neg Hx     Social History   Tobacco Use   Smoking status: Every Day    Packs/day:  0.25    Years: 34.00    Pack years: 8.50    Types: Cigarettes   Smokeless tobacco: Never  Vaping Use   Vaping Use: Never used  Substance Use Topics   Alcohol use: No   Drug use: No    Home Medications Prior to Admission medications   Medication Sig Start Date End Date Taking? Authorizing Provider  predniSONE (DELTASONE) 20 MG tablet Take 2 tablets (40 mg total) by mouth daily for 3 days. 08/21/20 08/24/20 Yes Margues Filippini, DO  amitriptyline (ELAVIL) 25 MG tablet Take by mouth. 05/20/20   [provider]  atorvastatin (LIPITOR) 80 MG tablet Take 1 tablet (80 mg total) by mouth daily. 08/05/17   Aliene Beams, MD  buPROPion (WELLBUTRIN XL) 300 MG 24 hr tablet Take 300 mg by mouth  daily. 02/27/18   [provider]  [facility] ASPIRIN ADULT LOW DOSE 81 MG chewable tablet Chew 81 mg by mouth daily. 12/22/17   [provider]  JANUVIA 100 MG tablet Take 100 mg by mouth daily. 05/16/20   [provider]  lisinopril (PRINIVIL,ZESTRIL) 40 MG tablet Take 1 tablet (40 mg total) by mouth daily. 03/23/17   Aliene Beams, MD  methocarbamol (ROBAXIN) 500 MG tablet Take 1 tablet (500 mg total) by mouth 2 (two) times daily as needed. 09/11/19   Cristie Hem, PA-C  metoprolol succinate (TOPROL-XL) 50 MG 24 hr tablet Take 1 tablet (50 mg total) by mouth daily. Take with or immediately following a meal. 04/27/17   Aliene Beams, MD  MODERNA COVID-19 VACCINE 100 MCG/0.5ML injection  01/28/20   [provider]  montelukast (SINGULAIR) 10 MG tablet Take 1 tablet by mouth daily. 05/16/20   [provider]  omeprazole (PRILOSEC) 40 MG capsule TAKE 1 CAPSULE BY MOUTH EVERY DAY 10/05/17   Anice Paganini, NP  rosuvastatin (CRESTOR) 40 MG tablet Take 40 mg by mouth daily. 05/16/20   [provider]  traMADol (ULTRAM) 50 MG tablet Take 1 tablet (50 mg total) by mouth every 6 (six) hours as needed. 12/06/19   Cristie Hem, PA-C  triamterene-hydrochlorothiazide (MAXZIDE-25) 37.5-25 MG tablet Take 1 tablet by mouth daily. 07/01/17   Aliene Beams, MD    Allergies    Latex  Review of Systems   Review of Systems  Constitutional:  Negative for chills and fever.  HENT:  Negative for ear pain and sore throat.   Eyes:  Negative for pain and visual disturbance.  Respiratory:  Positive for shortness of breath. Negative for cough, hemoptysis, sputum production and wheezing.   Cardiovascular:  Negative for chest pain, palpitations, claudication, syncope and PND.  Gastrointestinal:  Negative for abdominal pain and vomiting.  Genitourinary:  Negative for dysuria and hematuria.  Musculoskeletal:  Negative for arthralgias, back pain and neck pain.  Skin:  Negative  for color change and rash.  Neurological:  Negative for seizures, syncope and headaches.  All other systems reviewed and are negative.  Physical Exam Updated Vital Signs BP (!) 132/93 (BP Location: Right Arm)   Pulse 84   Temp 98.4 F (36.9 C) (Oral)   Resp 18   Ht 5\' 6"  (1.676 m)   Wt 77.1 kg   SpO2 99%   BMI 27.44 kg/m   Physical Exam Vitals and nursing note reviewed.  Constitutional:      General: She is not in acute distress.    Appearance: She is well-developed.  HENT:     Head: Normocephalic and atraumatic.  Nose: Nose normal.     Mouth/Throat:     Mouth: Mucous membranes are moist.  Eyes:     Extraocular Movements: Extraocular movements intact.     Conjunctiva/sclera: Conjunctivae normal.     Pupils: Pupils are equal, round, and reactive to light.  Cardiovascular:     Rate and Rhythm: Normal rate and regular rhythm.     Pulses: Normal pulses.     Heart sounds: Normal heart sounds. No murmur heard. Pulmonary:     Effort: Pulmonary effort is normal. No respiratory distress.     Breath sounds: No wheezing.  Abdominal:     General: Abdomen is flat.     Palpations: Abdomen is soft.     Tenderness: There is no abdominal tenderness.  Musculoskeletal:     Cervical back: Neck supple.  Skin:    General: Skin is warm and dry.     Capillary Refill: Capillary refill takes less than 2 seconds.  Neurological:     General: No focal deficit present.     Mental Status: She is alert.    ED Results / Procedures / Treatments   Labs (all labs ordered are listed, but only abnormal results are displayed) Labs Reviewed - No data to display  EKG EKG Interpretation  Date/Time:  Thursday August 21 2020 19:14:00 EDT Ventricular Rate:  82 PR Interval:  261 QRS Duration: 97 QT Interval:  392 QTC Calculation: 458 R Axis:   4 Text Interpretation: Sinus rhythm Prolonged PR interval Confirmed by Virgina Norfolk ([id number]) on 08/21/2020 7:18:46 PM  Radiology DG Chest Portable 1  View  Result Date: 08/21/2020 CLINICAL DATA:  Shortness of breath. exposed to some type of chemical at work. Pt states it was some type of mildew/mold cleaning. Pt c/o chest discomfort and headache. EXAM: PORTABLE CHEST 1 VIEW COMPARISON:  Chest x-ray 08/13/2020, CT chest 05/21/2020 FINDINGS: The heart and mediastinal contours are within normal limits. No focal consolidation. No pulmonary edema. No pleural effusion. No pneumothorax. No acute osseous abnormality. IMPRESSION: No active disease. Electronically Signed   By: Tish Frederickson M.D.   On: 08/21/2020 19:28    Procedures Procedures   Medications Ordered in ED Medications  predniSONE (DELTASONE) tablet 40 mg (has no administration in time range)    ED Course  I have reviewed the triage vital signs and the nursing notes.  Pertinent labs & imaging results that were available during my care of the patient were reviewed by me and considered in my medical decision making (see chart for details).    MDM Rules/Calculators/A&P                           Tasha Aguilar is here for shortness of breath after having some exposure at work from cleaning spray.  Normal vitals.  No fever.  Happened earlier in the week as well got worse again today.  Sounds like she was using the mildew mold cleaning spray and somebody else was using a bleach/alcohol type spray.  Had some difficulty breathing but feels better now that she has not been exposed any further.  History of bronchitis, history of heart attack.  Not having any chest pain.  No concern for ACS.  EKG shows sinus rhythm.  No ischemic changes.  Chest x-ray showed no evidence of pneumonia, no pneumothorax.  Will give prednisone to help with suspected mild bronchitis or irritation of the lungs from exposure.  She overall appears comfortable.  Discharged  in good condition.  This chart was dictated using voice recognition software.  Despite best efforts to proofread,  errors can occur which can change the  documentation meaning.   Final Clinical Impression(s) / ED Diagnoses Final diagnoses:  Chemical exposure    Rx / DC Orders ED Discharge Orders          Ordered    predniSONE (DELTASONE) 20 MG tablet  Daily        08/21/20 1932             Virgina NorfolkCuratolo, Wyatte Dames, DO 08/21/20 1934

## 2020-08-21 NOTE — ED Triage Notes (Signed)
[facility] report pt was exposed to some type of chemical at work. Pt states it was some type of mildew/mold cleaning. Pt c/o chest discomfort and headache.

## 2020-08-22 DIAGNOSIS — E785 Hyperlipidemia, unspecified: Secondary | ICD-10-CM | POA: Diagnosis not present

## 2020-08-22 DIAGNOSIS — E1140 Type 2 diabetes mellitus with diabetic neuropathy, unspecified: Secondary | ICD-10-CM | POA: Diagnosis not present

## 2020-08-22 DIAGNOSIS — J9801 Acute bronchospasm: Secondary | ICD-10-CM | POA: Diagnosis not present

## 2020-08-22 DIAGNOSIS — Z8719 Personal history of other diseases of the digestive system: Secondary | ICD-10-CM | POA: Diagnosis not present

## 2020-08-22 DIAGNOSIS — R1013 Epigastric pain: Secondary | ICD-10-CM | POA: Diagnosis not present

## 2020-08-22 DIAGNOSIS — E781 Pure hyperglyceridemia: Secondary | ICD-10-CM | POA: Diagnosis not present

## 2020-08-22 DIAGNOSIS — Z23 Encounter for immunization: Secondary | ICD-10-CM | POA: Diagnosis not present

## 2020-08-27 IMAGING — MG DIGITAL SCREENING BILATERAL MAMMOGRAM WITH TOMO AND CAD
7 series · 8 of 19 positions shown · non-contrast
Comparison: Previous exam(s).

CLINICAL DATA: Screening.

EXAM:
DIGITAL SCREENING BILATERAL MAMMOGRAM WITH TOMO AND CAD

[R CC synth-2D]
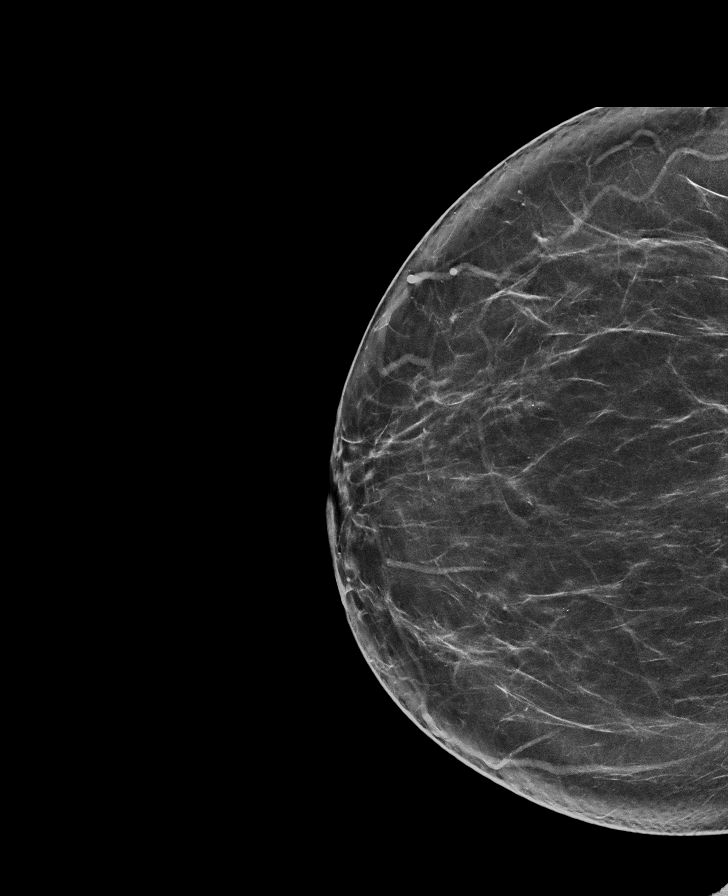

[L CC synth-2D]
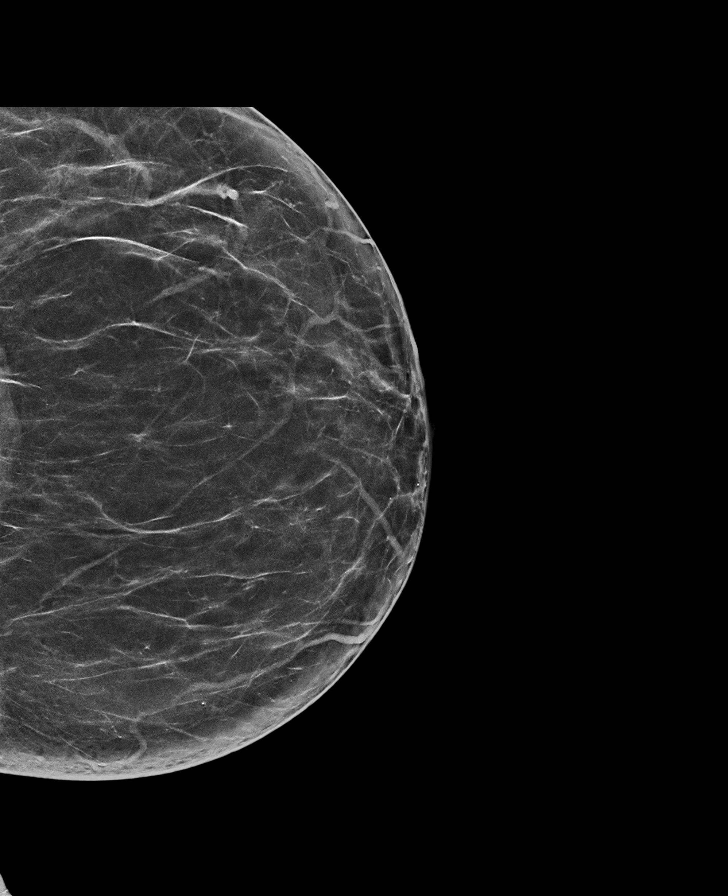

[L MLO synth-2D]
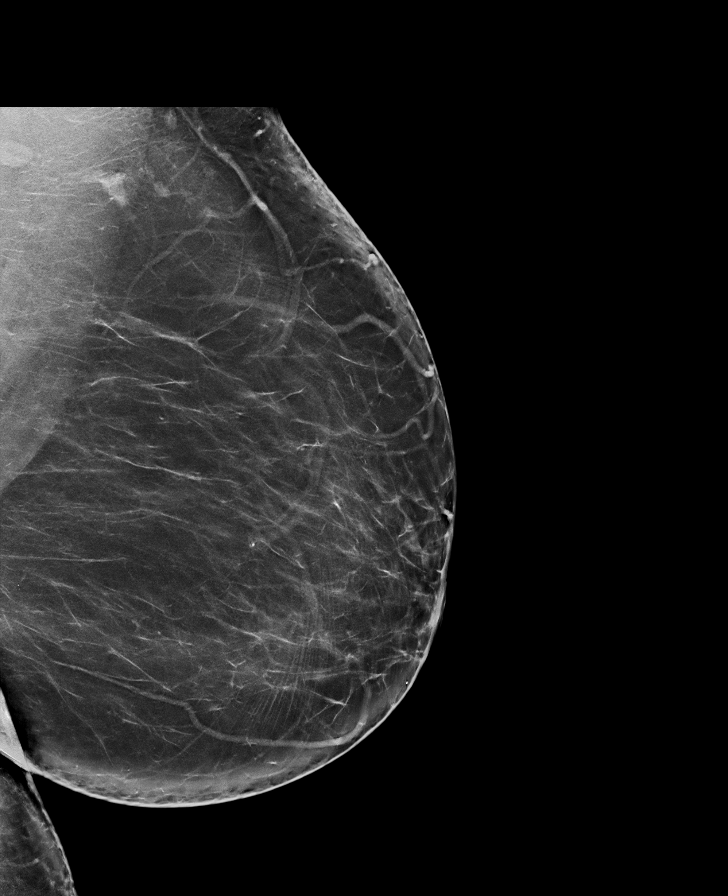

[R MLO synth-2D]
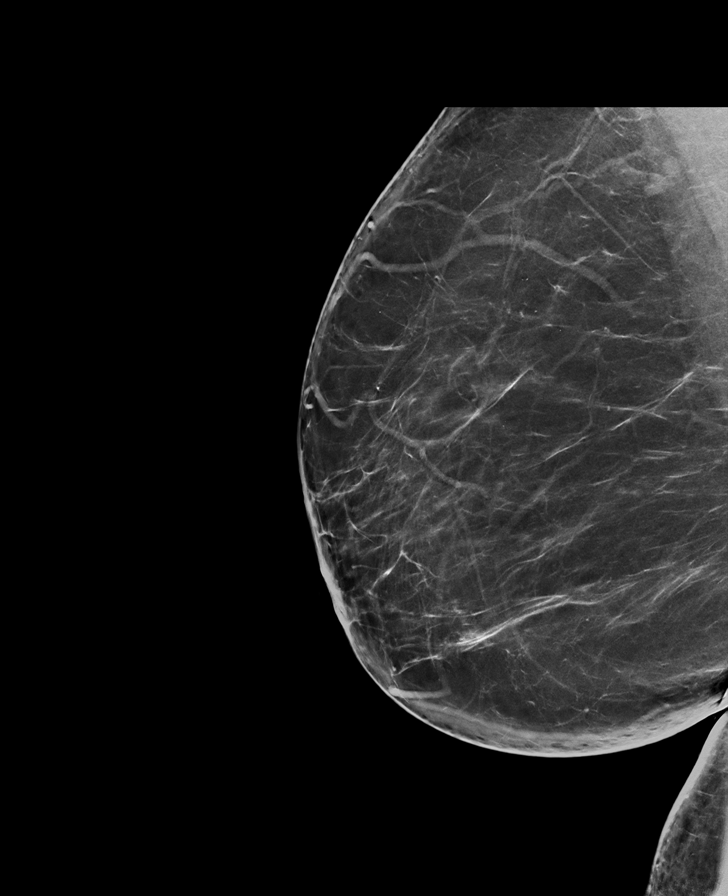

[R MLO tomo · 2 of 86 frames shown]
[frame 28/86]
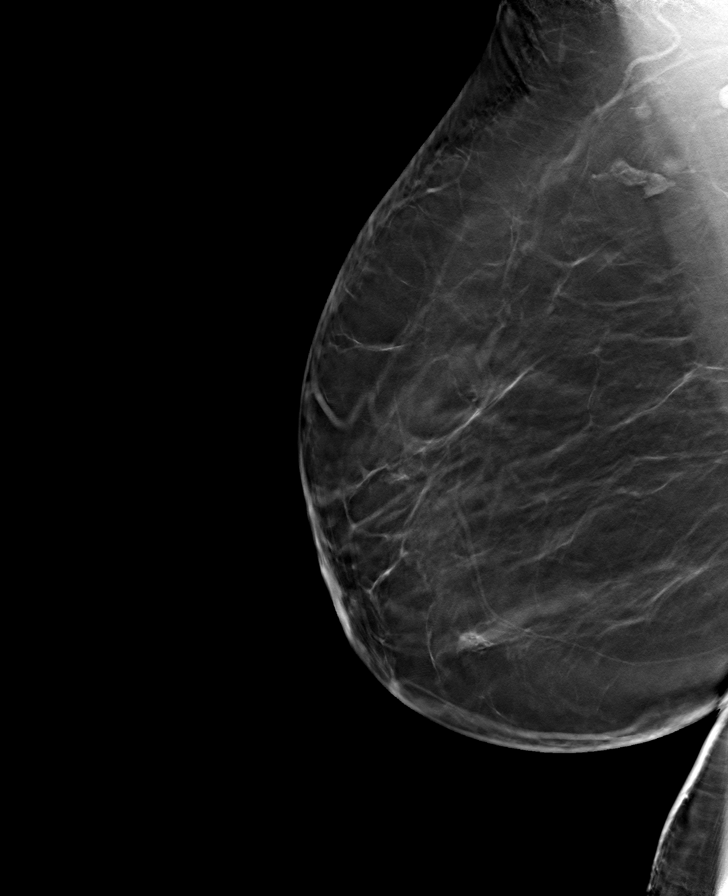
[frame 43/86]
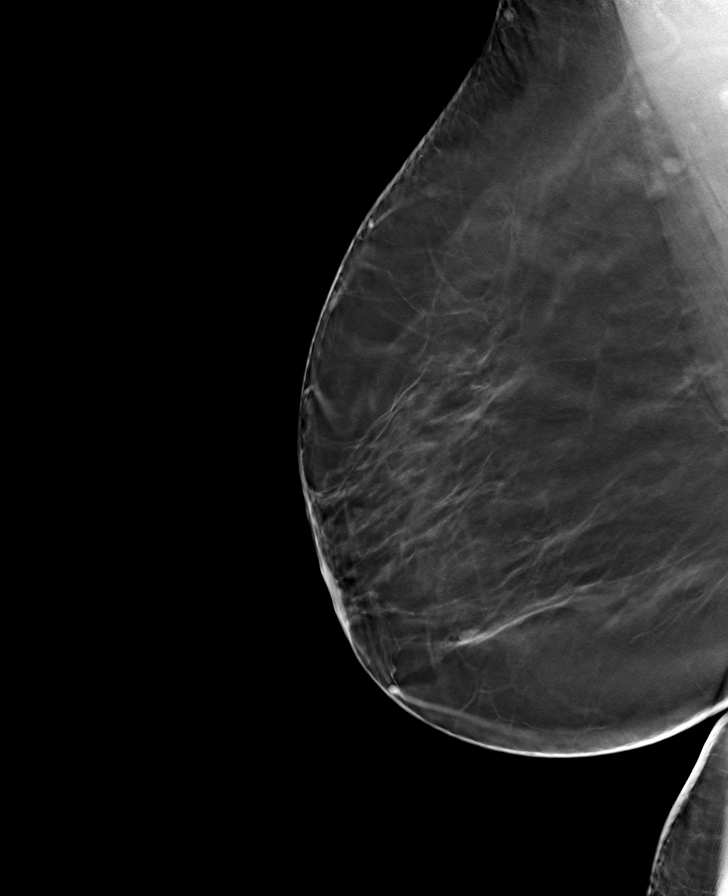

[L CC tomo · tomo slice 38/75.0]
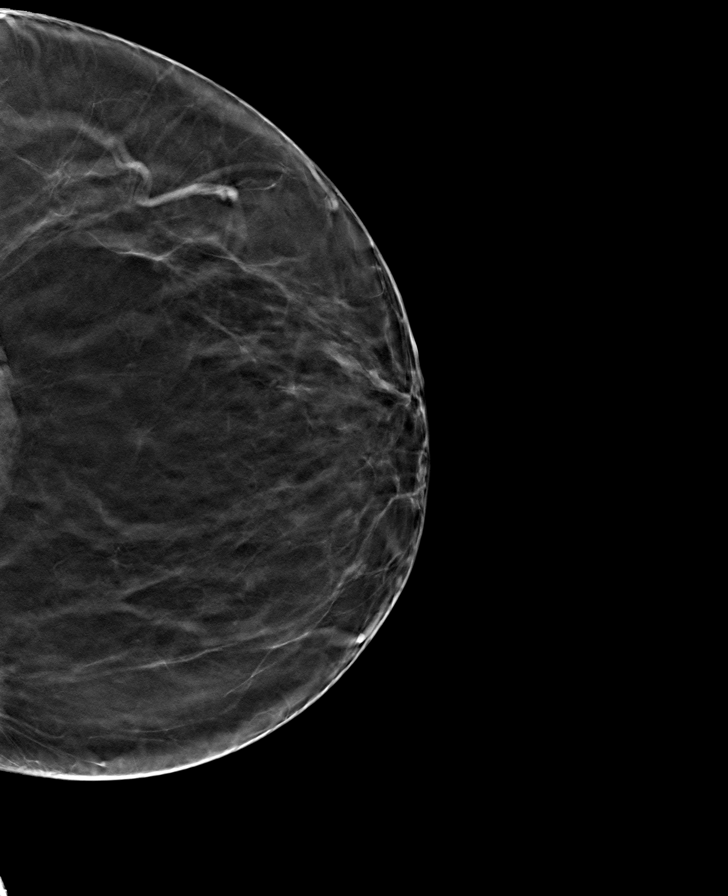

[R CC tomo · tomo slice 39/76.0]
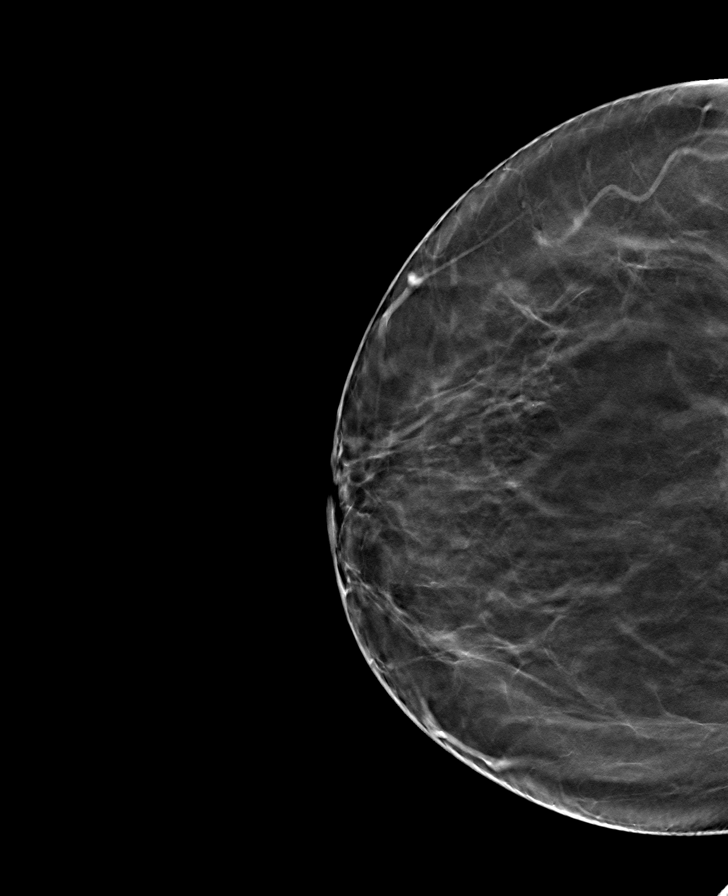

[8 of 19 positions shown; findings below may reference images not displayed]

ACR Breast Density Category b: There are scattered areas of
fibroglandular density.
FINDINGS: In the right breast, a possible asymmetry warrants further
evaluation. In the left breast, no findings suspicious for
malignancy. Images were processed with CAD.
IMPRESSION: Further evaluation is suggested for possible asymmetry in the right
breast.

RECOMMENDATION:
Diagnostic mammogram and possibly ultrasound of the right breast.
(Code:[id number]-U-55T)

The patient will be contacted regarding the findings, and additional
imaging will be scheduled.

BI-RADS CATEGORY  0: Incomplete. Need additional imaging evaluation
and/or prior mammograms for comparison.

## 2020-11-14 DIAGNOSIS — E785 Hyperlipidemia, unspecified: Secondary | ICD-10-CM | POA: Diagnosis not present

## 2020-11-18 ENCOUNTER — Other Ambulatory Visit: Payer: Self-pay | Admitting: Family Medicine

## 2020-11-24 ENCOUNTER — Other Ambulatory Visit: Payer: Self-pay | Admitting: Family Medicine

## 2020-11-24 DIAGNOSIS — R928 Other abnormal and inconclusive findings on diagnostic imaging of breast: Secondary | ICD-10-CM

## 2020-12-01 ENCOUNTER — Other Ambulatory Visit: Payer: Self-pay

## 2020-12-01 ENCOUNTER — Other Ambulatory Visit (HOSPITAL_COMMUNITY): Payer: Self-pay | Admitting: Family Medicine

## 2020-12-01 ENCOUNTER — Ambulatory Visit (HOSPITAL_COMMUNITY)
Admission: RE | Admit: 2020-12-01 | Discharge: 2020-12-01 | Disposition: A | Discharge status: Home / Self Care | Payer: Medicare Other | Source: Ambulatory Visit | Attending: Family Medicine | Admitting: Family Medicine

## 2020-12-01 DIAGNOSIS — Z91041 Radiographic dye allergy status: Secondary | ICD-10-CM | POA: Insufficient documentation

## 2020-12-01 DIAGNOSIS — I10 Essential (primary) hypertension: Secondary | ICD-10-CM | POA: Diagnosis not present

## 2020-12-03 ENCOUNTER — Other Ambulatory Visit: Payer: Medicare Other

## 2020-12-08 ENCOUNTER — Other Ambulatory Visit (HOSPITAL_COMMUNITY): Payer: Self-pay | Admitting: Family Medicine

## 2020-12-08 DIAGNOSIS — N6310 Unspecified lump in the right breast, unspecified quadrant: Secondary | ICD-10-CM

## 2020-12-10 ENCOUNTER — Inpatient Hospital Stay (HOSPITAL_COMMUNITY): Admission: RE | Admit: 2020-12-10 | Payer: Medicare Other | Source: Ambulatory Visit

## 2020-12-10 DIAGNOSIS — Z1231 Encounter for screening mammogram for malignant neoplasm of breast: Secondary | ICD-10-CM

## 2020-12-17 ENCOUNTER — Ambulatory Visit (HOSPITAL_COMMUNITY)
Admission: RE | Admit: 2020-12-17 | Discharge: 2020-12-17 | Disposition: A | Discharge status: Home / Self Care | Payer: Medicare Other | Source: Ambulatory Visit | Attending: Family Medicine | Admitting: Family Medicine

## 2020-12-17 ENCOUNTER — Encounter (HOSPITAL_COMMUNITY): Payer: Self-pay

## 2020-12-17 ENCOUNTER — Other Ambulatory Visit: Payer: Self-pay

## 2020-12-17 DIAGNOSIS — N6311 Unspecified lump in the right breast, upper outer quadrant: Secondary | ICD-10-CM | POA: Insufficient documentation

## 2020-12-17 DIAGNOSIS — N6310 Unspecified lump in the right breast, unspecified quadrant: Secondary | ICD-10-CM

## 2020-12-17 DIAGNOSIS — R922 Inconclusive mammogram: Secondary | ICD-10-CM | POA: Diagnosis not present

## 2021-01-08 DIAGNOSIS — K279 Peptic ulcer, site unspecified, unspecified as acute or chronic, without hemorrhage or perforation: Secondary | ICD-10-CM | POA: Diagnosis not present

## 2021-01-08 DIAGNOSIS — E118 Type 2 diabetes mellitus with unspecified complications: Secondary | ICD-10-CM | POA: Diagnosis not present

## 2021-01-08 DIAGNOSIS — Z720 Tobacco use: Secondary | ICD-10-CM | POA: Diagnosis not present

## 2021-01-08 DIAGNOSIS — Z23 Encounter for immunization: Secondary | ICD-10-CM | POA: Diagnosis not present

## 2021-01-08 DIAGNOSIS — I10 Essential (primary) hypertension: Secondary | ICD-10-CM | POA: Diagnosis not present

## 2021-01-14 ENCOUNTER — Other Ambulatory Visit: Payer: Self-pay | Admitting: Gastroenterology

## 2021-01-14 DIAGNOSIS — R109 Unspecified abdominal pain: Secondary | ICD-10-CM | POA: Diagnosis not present

## 2021-01-14 DIAGNOSIS — R935 Abnormal findings on diagnostic imaging of other abdominal regions, including retroperitoneum: Secondary | ICD-10-CM | POA: Diagnosis not present

## 2021-01-14 DIAGNOSIS — R9389 Abnormal findings on diagnostic imaging of other specified body structures: Secondary | ICD-10-CM

## 2021-01-15 DIAGNOSIS — H5213 Myopia, bilateral: Secondary | ICD-10-CM | POA: Diagnosis not present

## 2021-01-28 DIAGNOSIS — R1013 Epigastric pain: Secondary | ICD-10-CM | POA: Diagnosis not present

## 2021-01-28 DIAGNOSIS — K449 Diaphragmatic hernia without obstruction or gangrene: Secondary | ICD-10-CM | POA: Diagnosis not present

## 2021-02-05 ENCOUNTER — Other Ambulatory Visit: Payer: Medicare Other

## 2021-04-09 ENCOUNTER — Ambulatory Visit: Payer: Medicare Other | Admitting: Orthopaedic Surgery

## 2021-04-09 DIAGNOSIS — I700 Atherosclerosis of aorta: Secondary | ICD-10-CM | POA: Diagnosis not present

## 2021-04-09 DIAGNOSIS — Z23 Encounter for immunization: Secondary | ICD-10-CM | POA: Diagnosis not present

## 2021-04-09 DIAGNOSIS — M65311 Trigger thumb, right thumb: Secondary | ICD-10-CM | POA: Diagnosis not present

## 2021-04-09 DIAGNOSIS — Z720 Tobacco use: Secondary | ICD-10-CM | POA: Diagnosis not present

## 2021-04-09 DIAGNOSIS — E785 Hyperlipidemia, unspecified: Secondary | ICD-10-CM | POA: Diagnosis not present

## 2021-04-09 DIAGNOSIS — I10 Essential (primary) hypertension: Secondary | ICD-10-CM | POA: Diagnosis not present

## 2021-04-09 DIAGNOSIS — J439 Emphysema, unspecified: Secondary | ICD-10-CM | POA: Diagnosis not present

## 2021-04-09 DIAGNOSIS — E1140 Type 2 diabetes mellitus with diabetic neuropathy, unspecified: Secondary | ICD-10-CM | POA: Diagnosis not present

## 2021-04-09 DIAGNOSIS — R002 Palpitations: Secondary | ICD-10-CM | POA: Diagnosis not present

## 2021-04-13 ENCOUNTER — Ambulatory Visit: Payer: Medicare Other | Admitting: Physician Assistant

## 2021-05-03 NOTE — Progress Notes (Signed)
? ?Cardiology Office Note ?Date:  05/03/2021  ?Patient ID:  Tasha Aguilar, DOB Apr 01, 1965, MRN [id number] ?PCP:  Aliene Beams, MD  ?Cardiologist/Electrophysiologist: Dr. Elberta Fortis ? ?  ?Chief Complaint:  increase in palpitations ? ? ?History of Present Illness: ?Tasha Aguilar is a 56 y.o. female with history of DM, HTN, HLD, IBS, obesity, OA and symptomatic PVCs ? ? ?She comes in today to be seen for Dr. Elberta Fortis, last seen by him via telehealth visit June 2020.  She had stopped the flecainide given no improvement in her PVCs and had quit smoking as well.  ?Her PVCs were no longer as bothersome as they had previously been, burden was 1% on a prior monitor.  No changes were made with plans for an an annual visit. ? ?I saw her Oct 2021 ?She is all in all doing OK.  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 ?She in the last few weeks or months though has developed worsening palpitations, and feel different then what her PVCs over the years have felt like.  ?These are strong and somewhat painful, sudden lasting a few seconds, making her feel lightheaded wihen they occur.  They are very startling, vert strong and make her reflexively take in a deep breath. ?They are increasing in frequency and duration both.  No clear pattern though does not seem to notice them at night, more daytime, happening a couple times a week now. ?She reports her BP well controlled as long as she does not forget any of her medicines. ?Her PMD monitors and manages her lipids. ?Her father developed CAD in his 29s, had CABG and died early 42's ?Planned for increased coreg, monitoring, echo,  and given abn EKG and CV risk stress test. ? ?TTE noted LVEF 65-70%, mod LVH ?Resting image for her stress test noted mid anteroseptal defect, she did not return for stress images, so non-diagnostic, called placed to the patient without  answer ?Monitor with <1% PVCs ? ?She has had a number of ER visits ? ?June 2022 for CP, note reviewed, mentioned meducation non-compliances ?BUN/Creat 56/2.04 ?Trop 10, 10 ?EKG described SR 1st dgree AVblock, ?prolonged QT ? ? ?07/10/20< intractable N/V, d/c from ER [facility]) ?UDS was + cocaine ?CT neg for pancreatitis ?Labs, lipase reported as trending down and acute pancreatitis improving , surgery consult planned conservative management ?D/c from ER ? ?08/13/20: epigastric pain ?BUN/Creat 12/1.72 ?BS 259 ?CT scan shows findings consistent with peptic ulcer disease with possible perigastric inflammatory change ?D/c from ER with out GI recommended ? ?08/21/20, SOB after a chemical exposure ? ?TODAY ?She has had an up-tick in her palpitations again. ?She describes them exactly as she did last time she was here. ?No associated symptoms ?She does have som random CP,pin/tip of a knife poking high L chest ?No syncope ? ?She reports she is no longer using cocaine/drugs or ETOH ?Still smoking ?++ coffee ?Not working, no exercise, but stays busy ?Has gained weight ? ?She is not entirely certain her med list is accurate, but reports thst she is comliant with her medicines ?She reports seeing he PMD last month had labs done ?Aware of her abnormal kidney function ? ? ?Past Medical History:  ?Diagnosis Date  ? Abdominal pain 07/27/2017  ? AMI (acute myocardial infarction) (HCC) 2006  ? "mild"  ? Bronchitis 02/19/2017  ? Diabetes mellitus without complication (HCC)   ?  Essential hypertension 02/19/2017  ? Fatigue 02/19/2017  ? GERD (gastroesophageal reflux disease) 05/02/2017  ? Hyperlipidemia   ? Hyperlipidemia LDL goal <70 02/19/2017  ? Hypertension   ? IBS (irritable bowel syndrome) 07/27/2017  ? Ingrown toenail 04/27/2017  ? Irritable bowel syndrome with constipation 02/19/2017  ? Obesity (BMI 30.0-34.9) 04/27/2017  ? Primary osteoarthritis of right knee 04/27/2017  ? Tobacco abuse counseling 04/27/2017  ? Type 2 diabetes mellitus with  complication, without long-term current use of insulin (HCC) 02/19/2017  ? Vitamin D deficiency 02/19/2017  ? ? ?Past Surgical History:  ?Procedure Laterality Date  ? ABDOMINAL HYSTERECTOMY    ? due to menorrhagia  ? APPENDECTOMY    ? CESAREAN SECTION    ? x 4  ? CHOLECYSTECTOMY    ? JOINT REPLACEMENT    ? total left knee  ? LUNG BIOPSY    ? ? ?Current Outpatient Medications  ?Medication Sig Dispense Refill  ? amitriptyline (ELAVIL) 25 MG tablet Take by mouth.    ? atorvastatin (LIPITOR) 80 MG tablet Take 1 tablet (80 mg total) by mouth daily. 90 tablet 0  ? buPROPion (WELLBUTRIN XL) 300 MG 24 hr tablet Take 300 mg by mouth daily.    ? [facility] ASPIRIN ADULT LOW DOSE 81 MG chewable tablet Chew 81 mg by mouth daily.    ? JANUVIA 100 MG tablet Take 100 mg by mouth daily.    ? lisinopril (PRINIVIL,ZESTRIL) 40 MG tablet Take 1 tablet (40 mg total) by mouth daily. 90 tablet 3  ? methocarbamol (ROBAXIN) 500 MG tablet Take 1 tablet (500 mg total) by mouth 2 (two) times daily as needed. 20 tablet 0  ? metoprolol succinate (TOPROL-XL) 50 MG 24 hr tablet Take 1 tablet (50 mg total) by mouth daily. Take with or immediately following a meal. 90 tablet 3  ? MODERNA COVID-19 VACCINE 100 MCG/0.5ML injection     ? montelukast (SINGULAIR) 10 MG tablet Take 1 tablet by mouth daily.    ? omeprazole (PRILOSEC) 40 MG capsule TAKE 1 CAPSULE BY MOUTH EVERY DAY 30 capsule 5  ? rosuvastatin (CRESTOR) 40 MG tablet Take 40 mg by mouth daily.    ? traMADol (ULTRAM) 50 MG tablet Take 1 tablet (50 mg total) by mouth every 6 (six) hours as needed. 30 tablet 0  ? triamterene-hydrochlorothiazide (MAXZIDE-25) 37.5-25 MG tablet Take 1 tablet by mouth daily. 90 tablet 3  ? ?No current facility-administered medications for this visit.  ? ? ?Allergies:   Latex  ? ?Social History:  The patient  reports that she has been smoking cigarettes. She has a 8.50 pack-year smoking history. She has never used smokeless tobacco. She reports that she does not drink alcohol  and does not use drugs.  ? ?Family History:  The patient's family history includes Cancer in her brother; Diabetes in her brother, brother, father, and mother; Glaucoma in her father and mother; Heart attack in her mother; Heart disease in her father; Hypertension in her brother, brother, father, and mother; Kidney disease in her brother; Throat cancer in her father. ? ?ROS:  Please see the history of present illness.    ?All other systems are reviewed and otherwise negative.  ? ?PHYSICAL EXAM:  ?VS:  There were no vitals taken for this visit. BMI: There is no height or weight on file to calculate BMI. ?Well nourished, well developed, in no acute distress ?HEENT: normocephalic, atraumatic ?Neck: no JVD, carotid bruits or masses ?Cardiac:  RRR; no significant murmurs, no  rubs, or gallops ?Lungs:  CTA b/l, no wheezing, rhonchi or rales ?Abd: soft, nontender ?MS: no deformity or atrophy ?Ext: no edema ?Skin: warm and dry, no rash ?Neuro:  No gross deficits appreciated ?Psych: euthymic mood, full affect ? ? ? ?EKG:  Done today and reviewed by myself shows  ?SB 59bpm, normal intervals, no changes ? ?Oct 2021, monitor ?Max 150 bpm 01:22am, 10/31 ?Min 41 bpm 03:11am, 11/05 ?Avg 90 bpm ?<1% PVCs and supraventricular ectopy ?Predominant rhythm was sinus rhythm ?Symptoms associated with sinus rhythm and sinus tachycardia ? ? ?11/23/2019 ?Resting images showed a mid anteroseptal defect. ?The patient did not show up for stress imaging. ?Nondiagnostic study due to no stress images performed. ? ? ?11/13/2019: TTE ?1. Left ventricular ejection fraction, by estimation, is 65 to 70%. The  ?left ventricle has normal function. The left ventricle has no regional  ?wall motion abnormalities. There is moderate concentric left ventricular  ?hypertrophy. Left ventricular  ?diastolic parameters were normal.  ? 2. Right ventricular systolic function is normal. The right ventricular  ?size is normal.  ? 3. The mitral valve is normal in  structure. Trivial mitral valve  ?regurgitation. No evidence of mitral stenosis.  ? 4. The aortic valve is normal in structure. Aortic valve regurgitation is  ?not visualized. Mild to moderate aortic valve sclerosis/calcific

## 2021-05-06 ENCOUNTER — Encounter: Payer: Self-pay | Admitting: Physician Assistant

## 2021-05-06 ENCOUNTER — Telehealth (HOSPITAL_COMMUNITY): Payer: Self-pay

## 2021-05-06 ENCOUNTER — Encounter (HOSPITAL_BASED_OUTPATIENT_CLINIC_OR_DEPARTMENT_OTHER): Payer: Self-pay | Admitting: *Deleted

## 2021-05-06 ENCOUNTER — Ambulatory Visit (INDEPENDENT_AMBULATORY_CARE_PROVIDER_SITE_OTHER): Payer: Medicare Other | Admitting: Physician Assistant

## 2021-05-06 VITALS — BP 122/78 | HR 59 | Ht 66.0 in | Wt 184.0 lb

## 2021-05-06 DIAGNOSIS — R079 Chest pain, unspecified: Secondary | ICD-10-CM | POA: Diagnosis not present

## 2021-05-06 DIAGNOSIS — R002 Palpitations: Secondary | ICD-10-CM

## 2021-05-06 DIAGNOSIS — F17200 Nicotine dependence, unspecified, uncomplicated: Secondary | ICD-10-CM

## 2021-05-06 DIAGNOSIS — I493 Ventricular premature depolarization: Secondary | ICD-10-CM

## 2021-05-06 DIAGNOSIS — I10 Essential (primary) hypertension: Secondary | ICD-10-CM | POA: Diagnosis not present

## 2021-05-06 NOTE — Patient Instructions (Addendum)
?  Medication Instructions:  ? ?PLEASE SEND UPDATED ( ACCURATE) MEDICATION LIST TO MYCHART AT YOUR EARLIEST  AVAILABILITY ? ?*If you need a refill on your cardiac medications before your next appointment, please call your pharmacy* ? ? ?Lab Work: NONE ORDERED  TODAY ? ? ?If you have labs (blood work) drawn today and your tests are completely normal, you will receive your results only by: ?MyChart Message (if you have MyChart) OR ?A paper copy in the mail ?If you have any lab test that is abnormal or we need to change your treatment, we will call you to review the results. ? ? ?Testing/Procedures:  Your physician has requested that you have en exercise stress myoview. For further information please visit [email or web address] Please follow instruction sheet, as given. ? ? ?Follow-Up: ?At [facility], you and your health needs are our priority.  As part of our continuing mission to provide you with exceptional heart care, we have created designated Provider Care Teams.  These Care Teams include your primary Cardiologist (physician) and Advanced Practice Providers (APPs -  Physician Assistants and Nurse Practitioners) who all work together to provide you with the care you need, when you need it. ? ?We recommend signing up for the patient portal called "MyChart".  Sign up information is provided on this After Visit Summary.  MyChart is used to connect with patients for Virtual Visits (Telemedicine).  Patients are able to view lab/test results, encounter notes, upcoming appointments, etc.  Non-urgent messages can be sent to your provider as well.   ?To learn more about what you can do with MyChart, go to [email or web address].   ? ?Your next appointment:   ?6 month(s) ? ?The format for your next appointment:   ?In Person ? ?Provider:   ?You may see Dr. Elberta Fortis  or one of the following Advanced Practice Providers on your designated Care Team:   ?Francis Dowse, PA-C ?  ? ?Other Instructions ? ? ?Important Information  About Sugar ? ? ? ? ?  ?

## 2021-05-06 NOTE — Telephone Encounter (Signed)
Spoke with the patient. Detailed instructions given. She stated that she understood and would be here for her test. She also stated that she would do the Lexiscan but not walk on the treadmill. Asked to call back with any questions. S.Stephanee Barcomb EMTP ?

## 2021-05-12 ENCOUNTER — Ambulatory Visit (HOSPITAL_COMMUNITY): Payer: Medicare Other

## 2021-05-12 ENCOUNTER — Telehealth (HOSPITAL_COMMUNITY): Payer: Self-pay | Admitting: *Deleted

## 2021-05-12 NOTE — Telephone Encounter (Signed)
Patient given detailed instructions per Myocardial Perfusion Study Information Sheet for the test on 05/18/2021 at 10:30. Patient notified to arrive 15 minutes early and that it is imperative to arrive on time for appointment to keep from having the test rescheduled. ? If you need to cancel or reschedule your appointment, please call the office within 24 hours of your appointment. . Patient verbalized understanding.Nelson Chimes S ? ? ?

## 2021-05-15 DIAGNOSIS — G5621 Lesion of ulnar nerve, right upper limb: Secondary | ICD-10-CM | POA: Diagnosis not present

## 2021-05-15 DIAGNOSIS — M65311 Trigger thumb, right thumb: Secondary | ICD-10-CM | POA: Diagnosis not present

## 2021-05-15 DIAGNOSIS — G5601 Carpal tunnel syndrome, right upper limb: Secondary | ICD-10-CM | POA: Diagnosis not present

## 2021-05-18 ENCOUNTER — Ambulatory Visit (HOSPITAL_COMMUNITY): Payer: Medicare Other | Attending: Cardiology

## 2021-05-18 DIAGNOSIS — R079 Chest pain, unspecified: Secondary | ICD-10-CM | POA: Insufficient documentation

## 2021-05-18 LAB — MYOCARDIAL PERFUSION IMAGING
Base ST Depression (mm): 0 mm
LV dias vol: 89 mL (ref 46–106)
LV sys vol: 32 mL
Nuc Stress EF: 64 %
Peak HR: 82 {beats}/min
Rest HR: 65 {beats}/min
Rest Nuclear Isotope Dose: 10.9 mCi
SDS: 1
SRS: 0
SSS: 1
ST Depression (mm): 0 mm
Stress Nuclear Isotope Dose: 33 mCi
TID: 1.07

## 2021-05-18 MED ORDER — TECHNETIUM TC 99M TETROFOSMIN IV KIT
33.0000 | PACK | Freq: Once | INTRAVENOUS | Status: AC | PRN
  Administered 2021-05-18: 33 via INTRAVENOUS

## 2021-05-18 MED ORDER — TECHNETIUM TC 99M TETROFOSMIN IV KIT
10.9000 | PACK | Freq: Once | INTRAVENOUS | Status: AC | PRN
Start: 2021-05-18 — End: 2021-05-18
  Administered 2021-05-18: 10.9 via INTRAVENOUS

## 2021-05-18 MED ORDER — REGADENOSON 0.4 MG/5ML IV SOLN
0.4000 mg | Freq: Once | INTRAVENOUS | Status: AC
  Administered 2021-05-18: 0.4 mg via INTRAVENOUS

## 2021-05-21 ENCOUNTER — Encounter (HOSPITAL_COMMUNITY): Payer: Self-pay

## 2021-05-21 ENCOUNTER — Ambulatory Visit (HOSPITAL_COMMUNITY): Admission: RE | Admit: 2021-05-21 | Payer: Medicare Other | Source: Ambulatory Visit

## 2021-05-21 ENCOUNTER — Telehealth: Payer: Self-pay | Admitting: *Deleted

## 2021-05-21 ENCOUNTER — Inpatient Hospital Stay (HOSPITAL_COMMUNITY): Admission: RE | Admit: 2021-05-21 | Payer: Medicare Other | Source: Ambulatory Visit

## 2021-05-21 NOTE — Telephone Encounter (Signed)
Spoke with patient  about results verbalized understanding.

## 2021-05-21 NOTE — Telephone Encounter (Signed)
-----   Message from [facility], [place] sent at 05/18/2021  6:03 PM EDT ----- Looks good, no evidence of any heart blockage disease

## 2021-06-04 DIAGNOSIS — G5601 Carpal tunnel syndrome, right upper limb: Secondary | ICD-10-CM | POA: Diagnosis not present

## 2021-06-17 ENCOUNTER — Telehealth: Payer: Self-pay | Admitting: *Deleted

## 2021-06-17 NOTE — Telephone Encounter (Signed)
Left message for pt to call back to reschedule f/u lung screening CT scan.  ?

## 2021-07-22 DIAGNOSIS — E781 Pure hyperglyceridemia: Secondary | ICD-10-CM | POA: Diagnosis not present

## 2021-07-22 DIAGNOSIS — I700 Atherosclerosis of aorta: Secondary | ICD-10-CM | POA: Diagnosis not present

## 2021-07-22 DIAGNOSIS — E1142 Type 2 diabetes mellitus with diabetic polyneuropathy: Secondary | ICD-10-CM | POA: Diagnosis not present

## 2021-07-22 DIAGNOSIS — E1140 Type 2 diabetes mellitus with diabetic neuropathy, unspecified: Secondary | ICD-10-CM | POA: Diagnosis not present

## 2021-07-22 DIAGNOSIS — I10 Essential (primary) hypertension: Secondary | ICD-10-CM | POA: Diagnosis not present

## 2021-07-22 DIAGNOSIS — E785 Hyperlipidemia, unspecified: Secondary | ICD-10-CM | POA: Diagnosis not present

## 2021-07-22 DIAGNOSIS — K219 Gastro-esophageal reflux disease without esophagitis: Secondary | ICD-10-CM | POA: Diagnosis not present

## 2021-08-18 DIAGNOSIS — G5601 Carpal tunnel syndrome, right upper limb: Secondary | ICD-10-CM | POA: Diagnosis not present

## 2021-08-18 DIAGNOSIS — G5621 Lesion of ulnar nerve, right upper limb: Secondary | ICD-10-CM | POA: Diagnosis not present

## 2021-08-18 DIAGNOSIS — M65311 Trigger thumb, right thumb: Secondary | ICD-10-CM | POA: Diagnosis not present

## 2021-09-13 ENCOUNTER — Encounter (HOSPITAL_COMMUNITY): Payer: Self-pay

## 2021-09-13 ENCOUNTER — Emergency Department (HOSPITAL_COMMUNITY): Payer: Medicare Other

## 2021-09-13 ENCOUNTER — Other Ambulatory Visit: Payer: Self-pay

## 2021-09-13 ENCOUNTER — Emergency Department (HOSPITAL_COMMUNITY)
Admission: EM | Admit: 2021-09-13 | Discharge: 2021-09-13 | Disposition: A | Discharge status: Home / Self Care | Payer: Medicare Other | Attending: Emergency Medicine | Admitting: Emergency Medicine

## 2021-09-13 DIAGNOSIS — Z7984 Long term (current) use of oral hypoglycemic drugs: Secondary | ICD-10-CM | POA: Diagnosis not present

## 2021-09-13 DIAGNOSIS — E119 Type 2 diabetes mellitus without complications: Secondary | ICD-10-CM | POA: Diagnosis not present

## 2021-09-13 DIAGNOSIS — Z91040 Latex allergy status: Secondary | ICD-10-CM | POA: Insufficient documentation

## 2021-09-13 DIAGNOSIS — Z79899 Other long term (current) drug therapy: Secondary | ICD-10-CM | POA: Diagnosis not present

## 2021-09-13 DIAGNOSIS — M79675 Pain in left toe(s): Secondary | ICD-10-CM | POA: Diagnosis present

## 2021-09-13 DIAGNOSIS — M109 Gout, unspecified: Secondary | ICD-10-CM

## 2021-09-13 DIAGNOSIS — M7989 Other specified soft tissue disorders: Secondary | ICD-10-CM | POA: Diagnosis not present

## 2021-09-13 DIAGNOSIS — M79674 Pain in right toe(s): Secondary | ICD-10-CM | POA: Diagnosis not present

## 2021-09-13 DIAGNOSIS — M10072 Idiopathic gout, left ankle and foot: Secondary | ICD-10-CM | POA: Diagnosis not present

## 2021-09-13 LAB — URIC ACID: Uric Acid, Serum: 6.9 mg/dL (ref 2.5–7.1)

## 2021-09-13 LAB — COMPREHENSIVE METABOLIC PANEL
ALT: 27 U/L (ref 0–44)
AST: 18 U/L (ref 15–41)
Albumin: 3.9 g/dL (ref 3.5–5.0)
Alkaline Phosphatase: 176 U/L — ABNORMAL HIGH (ref 38–126)
Anion gap: 7 (ref 5–15)
BUN: 17 mg/dL (ref 6–20)
CO2: 27 mmol/L (ref 22–32)
Calcium: 9.4 mg/dL (ref 8.9–10.3)
Chloride: 107 mmol/L (ref 98–111)
Creatinine, Ser: 1.44 mg/dL — ABNORMAL HIGH (ref 0.44–1.00)
GFR, Estimated: 43 mL/min — ABNORMAL LOW (ref >60)
Glucose, Bld: 182 mg/dL — ABNORMAL HIGH (ref 70–99)
Potassium: 3.7 mmol/L (ref 3.5–5.1)
Sodium: 141 mmol/L (ref 135–145)
Total Bilirubin: 0.5 mg/dL (ref 0.3–1.2)
Total Protein: 7.2 g/dL (ref 6.5–8.1)

## 2021-09-13 LAB — CBC WITH DIFFERENTIAL/PLATELET
Abs Immature Granulocytes: 0.02 10*3/uL (ref 0.00–0.07)
Basophils Absolute: 0 10*3/uL (ref 0.0–0.1)
Basophils Relative: 0 %
Eosinophils Absolute: 0.1 10*3/uL (ref 0.0–0.5)
Eosinophils Relative: 2 %
HCT: 41.3 % (ref 36.0–46.0)
Hemoglobin: 13.1 g/dL (ref 12.0–15.0)
Immature Granulocytes: 0 %
Lymphocytes Relative: 34 %
Lymphs Abs: 2.3 10*3/uL (ref 0.7–4.0)
MCH: 26.8 pg (ref 26.0–34.0)
MCHC: 31.7 g/dL (ref 30.0–36.0)
MCV: 84.5 fL (ref 80.0–100.0)
Monocytes Absolute: 0.4 10*3/uL (ref 0.1–1.0)
Monocytes Relative: 6 %
Neutro Abs: 4 10*3/uL (ref 1.7–7.7)
Neutrophils Relative %: 58 %
Platelets: 182 10*3/uL (ref 150–400)
RBC: 4.89 MIL/uL (ref 3.87–5.11)
RDW: 14.3 % (ref 11.5–15.5)
WBC: 6.9 10*3/uL (ref 4.0–10.5)
nRBC: 0 % (ref 0.0–0.2)

## 2021-09-13 MED ORDER — HYDROMORPHONE HCL 1 MG/ML IJ SOLN
0.5000 mg | Freq: Once | INTRAMUSCULAR | Status: AC
  Administered 2021-09-13: 0.5 mg via INTRAVENOUS
  Filled 2021-09-13: qty 0.5

## 2021-09-13 MED ORDER — NAPROXEN 500 MG PO TABS: 500.0000 mg | ORAL_TABLET | Freq: Two times a day (BID) | ORAL | 0 refills | Status: AC

## 2021-09-13 MED ORDER — KETOROLAC TROMETHAMINE 30 MG/ML IJ SOLN
30.0000 mg | Freq: Once | INTRAMUSCULAR | Status: AC
  Administered 2021-09-13: 30 mg via INTRAVENOUS
  Filled 2021-09-13: qty 1

## 2021-09-13 MED ORDER — OXYCODONE-ACETAMINOPHEN 5-325 MG PO TABS: 1.0000 | ORAL_TABLET | Freq: Four times a day (QID) | ORAL | 0 refills | Status: DC | PRN

## 2021-09-13 MED ORDER — NAPROXEN 500 MG PO TABS: 500.0000 mg | ORAL_TABLET | Freq: Two times a day (BID) | ORAL | 0 refills | Status: DC

## 2021-09-13 MED ORDER — OXYCODONE-ACETAMINOPHEN 5-325 MG PO TABS: 1.0000 | ORAL_TABLET | Freq: Four times a day (QID) | ORAL | 0 refills | Status: AC | PRN

## 2021-09-13 NOTE — Discharge Instructions (Signed)
Follow-up with your family doctor next week for recheck. 

## 2021-09-13 NOTE — ED Provider Notes (Signed)
[facility] EMERGENCY DEPARTMENT Provider Note   CSN: [id number] Arrival date & time: 09/13/21  2050     History {Add pertinent medical, surgical, social history, OB history to HPI:1} Chief Complaint  Patient presents with   Toe Pain    ? gout    Tasha Aguilar is a 56 y.o. female.  Patient has pain in the right great toe.  She has a history of diabetes   Toe Pain       Home Medications Prior to Admission medications   Medication Sig Start Date End Date Taking? Authorizing Provider  amitriptyline (ELAVIL) 25 MG tablet Take by mouth at bedtime. 05/20/20   [provider]  amLODipine (NORVASC) 10 MG tablet Take 10 mg by mouth daily. 04/17/21   [provider]  atorvastatin (LIPITOR) 80 MG tablet Take 1 tablet (80 mg total) by mouth daily. Patient not taking: Reported on 05/06/2021 08/05/17   Aliene Beams, MD  buPROPion (WELLBUTRIN XL) 300 MG 24 hr tablet Take 300 mg by mouth daily. 02/27/18   [provider]  [facility] ASPIRIN ADULT LOW DOSE 81 MG chewable tablet Chew 81 mg by mouth daily. Patient not taking: Reported on 05/06/2021 12/22/17   [provider]  dapagliflozin propanediol (FARXIGA) 10 MG TABS tablet Take 10 mg by mouth daily.    [provider]  ezetimibe (ZETIA) 10 MG tablet Take 10 mg by mouth daily. 12/05/20   [provider]  famotidine (PEPCID) 20 MG tablet Take 20 mg by mouth 2 (two) times daily. 03/03/21   [provider]  glipiZIDE (GLUCOTROL XL) 5 MG 24 hr tablet Take 5 mg by mouth daily. 03/01/21   [provider]  JANUVIA 100 MG tablet Take 100 mg by mouth daily. 05/16/20   [provider]  lisinopril (PRINIVIL,ZESTRIL) 40 MG tablet Take 1 tablet (40 mg total) by mouth daily. 03/23/17   Aliene Beams, MD  methocarbamol (ROBAXIN) 500 MG tablet Take 1 tablet (500 mg total) by mouth 2 (two) times daily as needed. 09/11/19   Cristie Hem, PA-C  metoprolol succinate (TOPROL-XL) 50 MG 24 hr  tablet Take 1 tablet (50 mg total) by mouth daily. Take with or immediately following a meal. 04/27/17   Aliene Beams, MD  MODERNA COVID-19 VACCINE 100 MCG/0.5ML injection  01/28/20   [provider]  montelukast (SINGULAIR) 10 MG tablet Take 1 tablet by mouth daily. 05/16/20   [provider]  naproxen (NAPROSYN) 500 MG tablet Take 1 tablet (500 mg total) by mouth 2 (two) times daily with a meal. 09/13/21   Bethann Berkshire, MD  omeprazole (PRILOSEC) 40 MG capsule TAKE 1 CAPSULE BY MOUTH EVERY DAY Patient not taking: Reported on 05/06/2021 10/05/17   Anice Paganini, NP  oxyCODONE-acetaminophen (PERCOCET/ROXICET) 5-325 MG tablet Take 1 tablet by mouth every 6 (six) hours as needed for severe pain. 09/13/21   Bethann Berkshire, MD  rosuvastatin (CRESTOR) 40 MG tablet Take 40 mg by mouth daily. 05/16/20   [provider]  traMADol (ULTRAM) 50 MG tablet Take 1 tablet (50 mg total) by mouth every 6 (six) hours as needed. Patient not taking: Reported on 05/06/2021 12/06/19   Cristie Hem, PA-C  triamterene-hydrochlorothiazide (MAXZIDE-25) 37.5-25 MG tablet Take 1 tablet by mouth daily. 07/01/17   Aliene Beams, MD      Allergies    Latex    Review of Systems   Review of Systems  Physical Exam Updated Vital Signs BP 132/77 (BP Location:  Right Arm)   Pulse 81   Temp 98.1 F (36.7 C) (Oral)   Resp 18   Ht 5\' 6"  (1.676 m)   Wt 88.5 kg   SpO2 98%   BMI 31.47 kg/m  Physical Exam  ED Results / Procedures / Treatments   Labs (all labs ordered are listed, but only abnormal results are displayed) Labs Reviewed  COMPREHENSIVE METABOLIC PANEL - Abnormal; Notable for the following components:      Result Value   Glucose, Bld 182 (*)    Creatinine, Ser 1.44 (*)    Alkaline Phosphatase 176 (*)    GFR, Estimated 43 (*)    All other components within normal limits  CBC WITH DIFFERENTIAL/PLATELET  URIC ACID    EKG None  Radiology DG Toe Great Right  Result Date:  09/13/2021 CLINICAL DATA:  Redness, swelling and pain in the right great toe. EXAM: RIGHT GREAT TOE COMPARISON:  Right foot series 11/15/2019. FINDINGS: There is normal bone mineralization and alignment. No fracture or destructive bone lesion is seen. There is generalized swelling in the great toe beginning at the level of the first metatarsal head but no visible gas pockets in the soft tissues. Arthritic changes are not seen. IMPRESSION: Soft tissue swelling without acute underlying bone abnormality. Electronically Signed   By: 13/11/2019 M.D.   On: 09/13/2021 23:03    Procedures Procedures  {Document cardiac monitor, telemetry assessment procedure when appropriate:1}  Medications Ordered in ED Medications  HYDROmorphone (DILAUDID) injection 0.5 mg (0.5 mg Intravenous Given 09/13/21 2229)  ketorolac (TORADOL) 30 MG/ML injection 30 mg (30 mg Intravenous Given 09/13/21 2229)    ED Course/ Medical Decision Making/ A&P                           Medical Decision Making Amount and/or Complexity of Data Reviewed Labs: ordered. Radiology: ordered.  Risk Prescription drug management.   Patient most likely gout in the right great toe.  She is given Percocet and Naprosyn and will follow-up with PCP  {Document critical care time when appropriate:1} {Document review of labs and clinical decision tools ie heart score, Chads2Vasc2 etc:1}  {Document your independent review of radiology images, and any outside records:1} {Document your discussion with family members, caretakers, and with consultants:1} {Document social determinants of health affecting pt's care:1} {Document your decision making why or why not admission, treatments were needed:1} Final Clinical Impression(s) / ED Diagnoses Final diagnoses:  Gouty arthritis of left great toe    Rx / DC Orders ED Discharge Orders          Ordered    naproxen (NAPROSYN) 500 MG tablet  2 times daily with meals,   Status:  Discontinued         09/13/21 2316    oxyCODONE-acetaminophen (PERCOCET/ROXICET) 5-325 MG tablet  Every 6 hours PRN,   Status:  Discontinued        09/13/21 2316    oxyCODONE-acetaminophen (PERCOCET/ROXICET) 5-325 MG tablet  Every 6 hours PRN        09/13/21 2317    naproxen (NAPROSYN) 500 MG tablet  2 times daily with meals        09/13/21 2317

## 2021-09-13 NOTE — ED Triage Notes (Signed)
Pt to ED from home with c/o right  great toe pain that started today, toe is red, swollen, and warm to touch, appears like gout

## 2021-10-22 ENCOUNTER — Encounter: Payer: Self-pay | Admitting: *Deleted

## 2021-11-11 ENCOUNTER — Ambulatory Visit: Payer: Medicare Other | Admitting: Cardiology

## 2021-11-11 NOTE — Progress Notes (Deleted)
Electrophysiology Office Note   Date:  11/11/2021   ID:  Tasha Aguilar, DOB 03/19/1965, MRN [id number]  PCP:  Aliene Beams, MD  Cardiologist:   Primary Electrophysiologist:  Lenzie Sandler Jorja Loa, MD    No chief complaint on file.    History of Present Illness: Tasha Aguilar is a 57 y.o. female who is being seen today for the evaluation of palpitations at the request of Aliene Beams, MD. Presenting today for electrophysiology evaluation.    The history seen for diabetes, hypertension, hyperlipidemia.  She has a history of rare PVCs.  She wore a monitor that showed a burden of less than 1%.  Despite this, she is quite symptomatic and is now on flecainide.  Today, denies symptoms of palpitations, chest pain, shortness of breath, orthopnea, PND, lower extremity edema, claudication, dizziness, presyncope, syncope, bleeding, or neurologic sequela. The patient is tolerating medications without difficulties. ***   Past Medical History:  Diagnosis Date   Abdominal pain 07/27/2017   AMI (acute myocardial infarction) (HCC) 2006   "mild"   Bronchitis 02/19/2017   Diabetes mellitus without complication (HCC)    Essential hypertension 02/19/2017   Fatigue 02/19/2017   GERD (gastroesophageal reflux disease) 05/02/2017   Hyperlipidemia    Hyperlipidemia LDL goal <70 02/19/2017   Hypertension    IBS (irritable bowel syndrome) 07/27/2017   Ingrown toenail 04/27/2017   Irritable bowel syndrome with constipation 02/19/2017   Obesity (BMI 30.0-34.9) 04/27/2017   Primary osteoarthritis of right knee 04/27/2017   Tobacco abuse counseling 04/27/2017   Type 2 diabetes mellitus with complication, without long-term current use of insulin (HCC) 02/19/2017   Vitamin D deficiency 02/19/2017   Past Surgical History:  Procedure Laterality Date   ABDOMINAL HYSTERECTOMY     due to menorrhagia   APPENDECTOMY     CESAREAN SECTION     x 4   CHOLECYSTECTOMY     JOINT REPLACEMENT     total left knee   LUNG  BIOPSY       Current Outpatient Medications  Medication Sig Dispense Refill   amitriptyline (ELAVIL) 25 MG tablet Take by mouth at bedtime.     amLODipine (NORVASC) 10 MG tablet Take 10 mg by mouth daily.     atorvastatin (LIPITOR) 80 MG tablet Take 1 tablet (80 mg total) by mouth daily. (Patient not taking: Reported on 05/06/2021) 90 tablet 0   buPROPion (WELLBUTRIN XL) 300 MG 24 hr tablet Take 300 mg by mouth daily.     [facility] ASPIRIN ADULT LOW DOSE 81 MG chewable tablet Chew 81 mg by mouth daily. (Patient not taking: Reported on 05/06/2021)     dapagliflozin propanediol (FARXIGA) 10 MG TABS tablet Take 10 mg by mouth daily.     ezetimibe (ZETIA) 10 MG tablet Take 10 mg by mouth daily.     famotidine (PEPCID) 20 MG tablet Take 20 mg by mouth 2 (two) times daily.     glipiZIDE (GLUCOTROL XL) 5 MG 24 hr tablet Take 5 mg by mouth daily.     JANUVIA 100 MG tablet Take 100 mg by mouth daily.     lisinopril (PRINIVIL,ZESTRIL) 40 MG tablet Take 1 tablet (40 mg total) by mouth daily. 90 tablet 3   methocarbamol (ROBAXIN) 500 MG tablet Take 1 tablet (500 mg total) by mouth 2 (two) times daily as needed. 20 tablet 0   metoprolol succinate (TOPROL-XL) 50 MG 24 hr tablet Take 1 tablet (50 mg total) by mouth daily. Take with or  immediately following a meal. 90 tablet 3   MODERNA COVID-19 VACCINE 100 MCG/0.5ML injection      montelukast (SINGULAIR) 10 MG tablet Take 1 tablet by mouth daily.     naproxen (NAPROSYN) 500 MG tablet Take 1 tablet (500 mg total) by mouth 2 (two) times daily with a meal. 30 tablet 0   omeprazole (PRILOSEC) 40 MG capsule TAKE 1 CAPSULE BY MOUTH EVERY DAY (Patient not taking: Reported on 05/06/2021) 30 capsule 5   oxyCODONE-acetaminophen (PERCOCET/ROXICET) 5-325 MG tablet Take 1 tablet by mouth every 6 (six) hours as needed for severe pain. 20 tablet 0   rosuvastatin (CRESTOR) 40 MG tablet Take 40 mg by mouth daily.     traMADol (ULTRAM) 50 MG tablet Take 1 tablet (50 mg total) by mouth  every 6 (six) hours as needed. (Patient not taking: Reported on 05/06/2021) 30 tablet 0   triamterene-hydrochlorothiazide (MAXZIDE-25) 37.5-25 MG tablet Take 1 tablet by mouth daily. 90 tablet 3   No current facility-administered medications for this visit.    Allergies:   Latex   Social History:  The patient  reports that she has been smoking cigarettes. She has a 8.50 pack-year smoking history. She has never used smokeless tobacco. She reports that she does not drink alcohol and does not use drugs.   Family History:  The patient's family history includes Cancer in her brother; Diabetes in her brother, brother, father, and mother; Glaucoma in her father and mother; Heart attack in her mother; Heart disease in her father; Hypertension in her brother, brother, father, and mother; Kidney disease in her brother; Throat cancer in her father.   ROS:  Please see the history of present illness.   Otherwise, review of systems is positive for none.   All other systems are reviewed and negative.   PHYSICAL EXAM: VS:  There were no vitals taken for this visit. , BMI There is no height or weight on file to calculate BMI. GEN: Well nourished, well developed, in no acute distress  HEENT: normal  Neck: no JVD, carotid bruits, or masses Cardiac: ***RRR; no murmurs, rubs, or gallops,no edema  Respiratory:  clear to auscultation bilaterally, normal work of breathing GI: soft, nontender, nondistended, + BS MS: no deformity or atrophy  Skin: warm and dry Neuro:  Strength and sensation are intact Psych: euthymic mood, full affect  EKG:  EKG {ACTION; IS/IS [id number]} ordered today. Personal review of the ekg ordered *** shows ***   Recent Labs: 09/13/2021: ALT 27; BUN 17; Creatinine, Ser 1.44; Hemoglobin 13.1; Platelets 182; Potassium 3.7; Sodium 141    Lipid Panel     Component Value Date/Time   CHOL 180 09/15/2017 1055   TRIG 169 (H) 09/15/2017 1055   HDL 39 (L) 09/15/2017 1055   CHOLHDL 4.6  09/15/2017 1055   LDLCALC 112 (H) 09/15/2017 1055     Wt Readings from Last 3 Encounters:  09/13/21 195 lb (88.5 kg)  05/06/21 184 lb (83.5 kg)  08/21/20 170 lb (77.1 kg)      Other studies Reviewed: Additional studies/ records that were reviewed today include: Holter 11/29/17 - personally reviewed Minimum: 57 bpm  Maximum: 129 bpm  Average: 73 bpm Rare ventricular and supraventricular ectopy 0% atrial fibrillation burden Intermittent couplets and ventricular runs, maximum 3 beats  TTE 2017 Normal LV size and function with mild LVH, ejection fraction 65 to 70%  ASSESSMENT AND PLAN:  1.  PVCs: Patient having less than 1% on cardiac monitor.  Currently on  flecainide 100 mg twice daily.  She was continuing to have symptoms and thus flecainide was increased.***  2.  Tobacco abuse: Complete cessation encouraged   Current medicines are reviewed at length with the patient today.   The patient does not have concerns regarding her medicines.  The following changes were made today: ***  Labs/ tests ordered today include:  No orders of the defined types were placed in this encounter.    Disposition:   FU with Airis Barbee *** months  Signed, Haneen Bernales Jorja Loa, MD  11/11/2021 7:31 AM     [facility] [address] [place] [address] [phone] (office) [phone] (fax)

## 2021-11-21 DIAGNOSIS — M25431 Effusion, right wrist: Secondary | ICD-10-CM | POA: Diagnosis not present

## 2021-11-21 DIAGNOSIS — M25531 Pain in right wrist: Secondary | ICD-10-CM | POA: Diagnosis not present

## 2021-12-01 ENCOUNTER — Ambulatory Visit (INDEPENDENT_AMBULATORY_CARE_PROVIDER_SITE_OTHER): Payer: Medicare Other

## 2021-12-01 ENCOUNTER — Ambulatory Visit (INDEPENDENT_AMBULATORY_CARE_PROVIDER_SITE_OTHER): Payer: Medicare Other | Admitting: Orthopaedic Surgery

## 2021-12-01 DIAGNOSIS — M545 Low back pain, unspecified: Secondary | ICD-10-CM

## 2021-12-01 MED ORDER — PREDNISONE 5 MG (21) PO TBPK: ORAL_TABLET | ORAL | 0 refills | Status: AC

## 2021-12-01 MED ORDER — METHOCARBAMOL 750 MG PO TABS: 750.0000 mg | ORAL_TABLET | Freq: Two times a day (BID) | ORAL | 2 refills | Status: AC | PRN

## 2021-12-01 NOTE — Progress Notes (Signed)
Office Visit Note   Patient: Tasha Aguilar           Date of Birth: 08-Jan-1965           MRN: [id number] Visit Date: 12/01/2021              Requested by: Aliene Beams, MD [id number] [place],  [place] [address] PCP: Aliene Beams, MD   Assessment & Plan: Visit Diagnoses:  1. Low back pain, unspecified back pain laterality, unspecified chronicity, unspecified whether sciatica present     Plan: Impression is chronic right-sided low back pain with right lower extremity sciatica.  At this point, I would like to make referral back to Dr. Alvester Morin for repeat injection.  In the meantime, I have started her on a steroid pack and muscle relaxer.  Should her symptoms not improve following injection with Dr. Alvester Morin recommended referral to Dr. Christell Constant for further evaluation and treatment recommendation.  Call with concerns or questions in the meantime.  Follow-Up Instructions: Return if symptoms worsen or fail to improve.   Orders:  Orders Placed This Encounter  Procedures   XR Lumbar Spine 2-3 Views   Ambulatory referral to Physical Medicine Rehab   Meds ordered this encounter  Medications   predniSONE (STERAPRED UNI-PAK 21 TAB) 5 MG (21) TBPK tablet    Sig: Take as directed    Dispense:  21 tablet    Refill:  0   methocarbamol (ROBAXIN-750) 750 MG tablet    Sig: Take 1 tablet (750 mg total) by mouth 2 (two) times daily as needed for muscle spasms.    Dispense:  20 tablet    Refill:  2      Procedures: No procedures performed   Clinical Data: No additional findings.   Subjective: Chief Complaint  Patient presents with   Lower Back - Pain    HPI patient is a pleasant 56 year old female who comes in today with right-sided low back pain for the past several years which has progressively worsened.  She denies any new injury or change in activity.  The pain is to the right lower back and radiates down the buttock and down the back of the right leg.  She notes paresthesias  into the right foot.  Symptoms are constant but worse when she is lying down.  She has taken over-the-counter medicine for pain without significant relief.  She does note that she has been to physical therapy in the past without relief.  She has been seen by Dr. Alvester Morin in the past as well where she has had several steroid injections.  No previous surgical intervention to the lumbar spine.  She denies any bowel or bladder change or saddle paresthesias.  Review of Systems as detailed in HPI.  All others reviewed and are negative.   Objective: Vital Signs: There were no vitals taken for this visit.  Physical Exam well-developed well-nourished female no acute distress.  Alert and oriented x3.  Ortho Exam lumbar spine exam reveals no spinous tenderness.  She does have right-sided paraspinous tenderness.  Markedly positive straight leg raise.  Increased pain with lumbar extension.  No focal weakness.  She is neurovascular intact distally.  Specialty Comments:  No specialty comments available.  Imaging: XR Lumbar Spine 2-3 Views  Result Date: 12/01/2021 X-rays demonstrate degenerative changes L4-5 with an anterior listhesis    PMFS History: Patient Active Problem List   Diagnosis Date Noted   IBS (irritable bowel syndrome) 07/27/2017   Abdominal pain  07/27/2017   GERD (gastroesophageal reflux disease) 05/02/2017   Tobacco abuse counseling 04/27/2017   Obesity (BMI 30.0-34.9) 04/27/2017   Ingrown toenail 04/27/2017   Primary osteoarthritis of right knee 04/27/2017   Essential hypertension 02/19/2017   Hyperlipidemia LDL goal <70 02/19/2017   Type 2 diabetes mellitus with complication, without long-term current use of insulin (HCC) 02/19/2017   Bronchitis 02/19/2017   Screening for breast cancer 02/19/2017   Immunization due 02/19/2017   Screen for colon cancer 02/19/2017   Vitamin D deficiency 02/19/2017   Fatigue 02/19/2017   Past Medical History:  Diagnosis Date   Abdominal pain  07/27/2017   AMI (acute myocardial infarction) (HCC) 2006   "mild"   Bronchitis 02/19/2017   Diabetes mellitus without complication (HCC)    Essential hypertension 02/19/2017   Fatigue 02/19/2017   GERD (gastroesophageal reflux disease) 05/02/2017   Hyperlipidemia    Hyperlipidemia LDL goal <70 02/19/2017   Hypertension    IBS (irritable bowel syndrome) 07/27/2017   Ingrown toenail 04/27/2017   Irritable bowel syndrome with constipation 02/19/2017   Obesity (BMI 30.0-34.9) 04/27/2017   Primary osteoarthritis of right knee 04/27/2017   Tobacco abuse counseling 04/27/2017   Type 2 diabetes mellitus with complication, without long-term current use of insulin (HCC) 02/19/2017   Vitamin D deficiency 02/19/2017    Family History  Problem Relation Age of Onset   Diabetes Mother    Glaucoma Mother    Hypertension Mother    Heart attack Mother    Hypertension Father    Diabetes Father    Heart disease Father    Glaucoma Father    Throat cancer Father    Kidney disease Brother    Diabetes Brother    Hypertension Brother    Hypertension Brother    Diabetes Brother    Cancer Brother        angiosarcoma   Colon cancer Neg Hx    Gastric cancer Neg Hx    Esophageal cancer Neg Hx     Past Surgical History:  Procedure Laterality Date   ABDOMINAL HYSTERECTOMY     due to menorrhagia   APPENDECTOMY     CESAREAN SECTION     x 4   CHOLECYSTECTOMY     JOINT REPLACEMENT     total left knee   LUNG BIOPSY     Social History   Occupational History   Not on file  Tobacco Use   Smoking status: Every Day    Packs/day: 0.25    Years: 34.00    Total pack years: 8.50    Types: Cigarettes   Smokeless tobacco: Never  Vaping Use   Vaping Use: Never used  Substance and Sexual Activity   Alcohol use: No   Drug use: No   Sexual activity: Yes    Birth control/protection: Surgical

## 2021-12-11 ENCOUNTER — Telehealth: Payer: Self-pay | Admitting: Orthopedic Surgery

## 2021-12-11 NOTE — Telephone Encounter (Signed)
Patient need assistance with her transportation. Please call..[phone]

## 2021-12-17 ENCOUNTER — Ambulatory Visit: Payer: Medicare Other | Admitting: Physical Medicine and Rehabilitation

## 2021-12-23 ENCOUNTER — Telehealth: Payer: Self-pay | Admitting: Physician Assistant

## 2021-12-23 NOTE — Telephone Encounter (Signed)
Patient called. She would like the RX for her roller walker and bath chair sent to [facility] [phone]

## 2021-12-24 NOTE — Telephone Encounter (Signed)
Faxed order to number provided, along with demographics.

## 2021-12-30 ENCOUNTER — Telehealth: Payer: Self-pay | Admitting: Orthopaedic Surgery

## 2021-12-30 NOTE — Telephone Encounter (Signed)
Refaxed with NPI number on script. Also attached last office notes and demographics.

## 2021-12-30 NOTE — Telephone Encounter (Signed)
[facility] called in stating they need a corrected order with Chart notes and the NPI attached to order faxed to [phone]

## 2023-01-10 IMAGING — DX DG CHEST 2V
2 series · 2 of 2 positions shown · non-contrast
Comparison: August 21, 2020.

CLINICAL DATA: Hypertension.

EXAM:
CHEST - 2 VIEW

[chest pa]
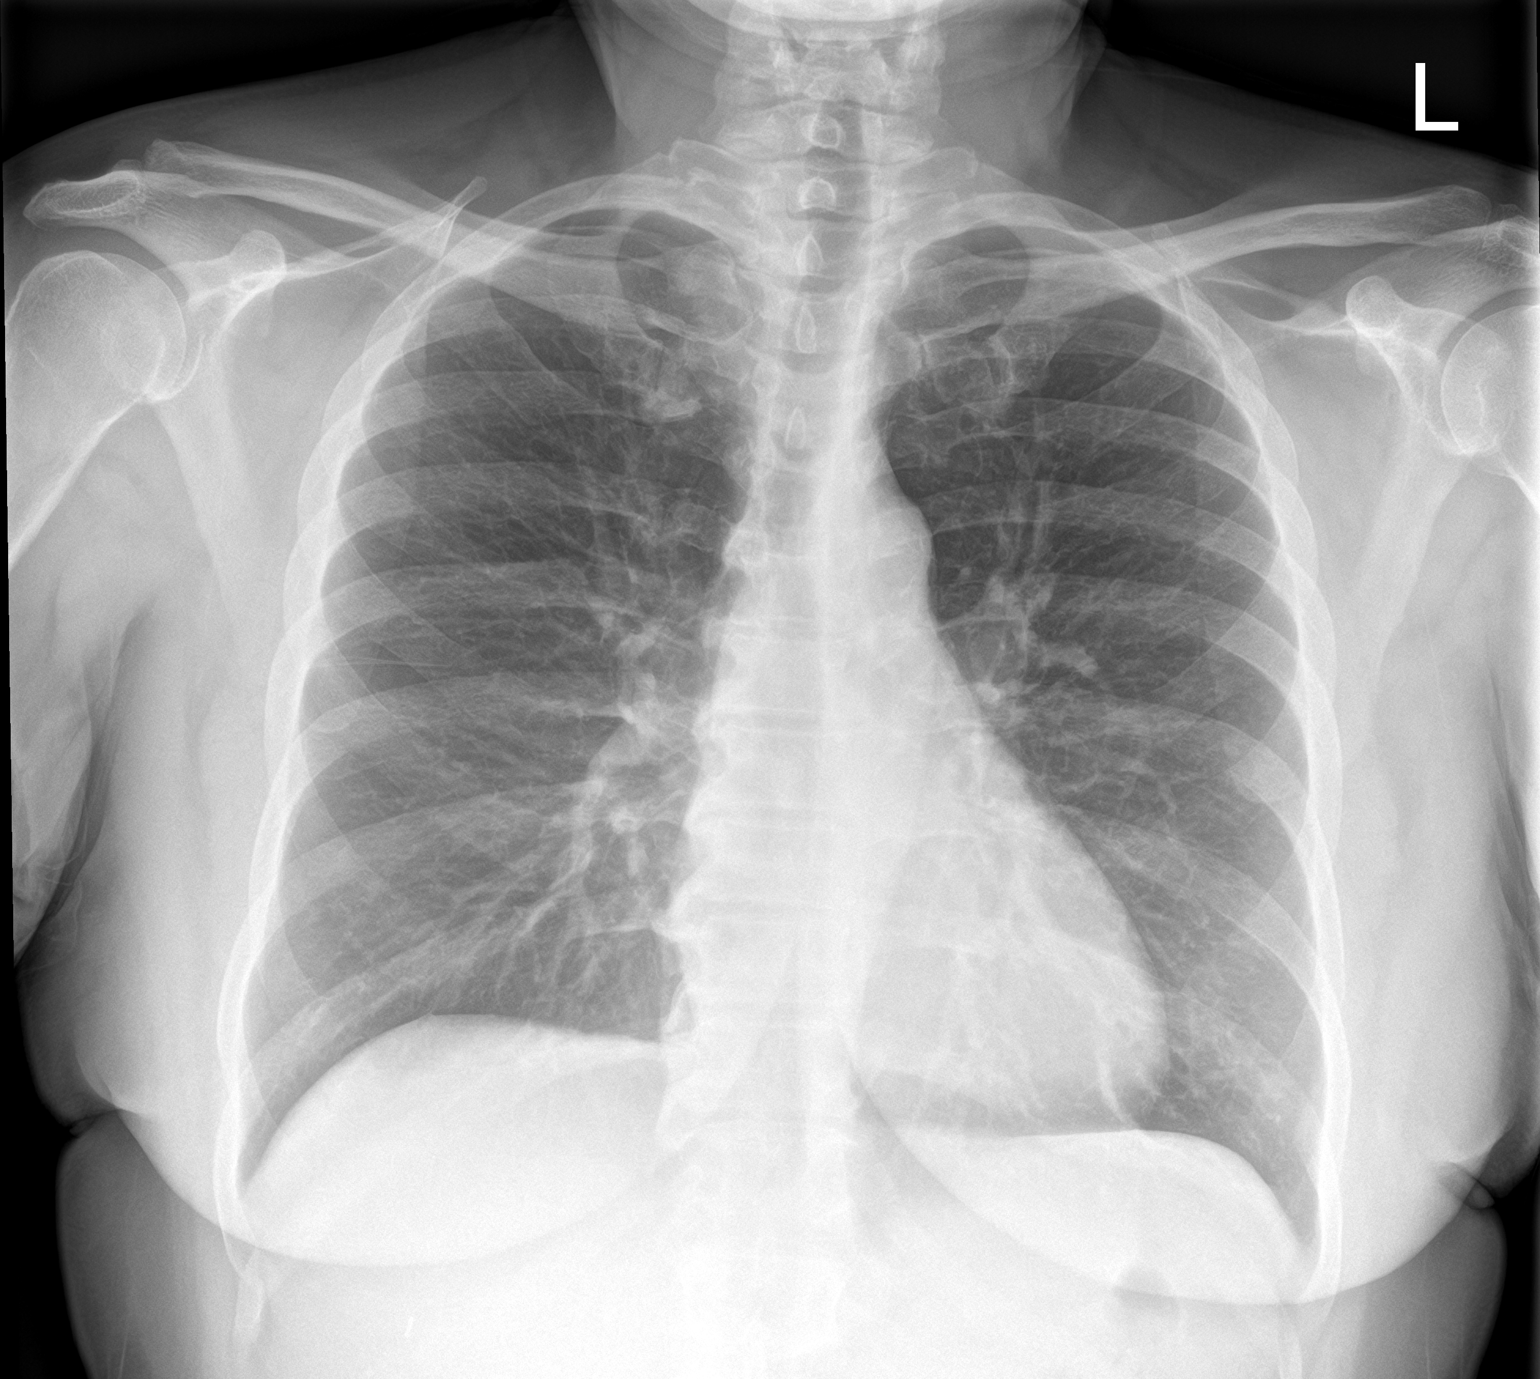

[chest lat]
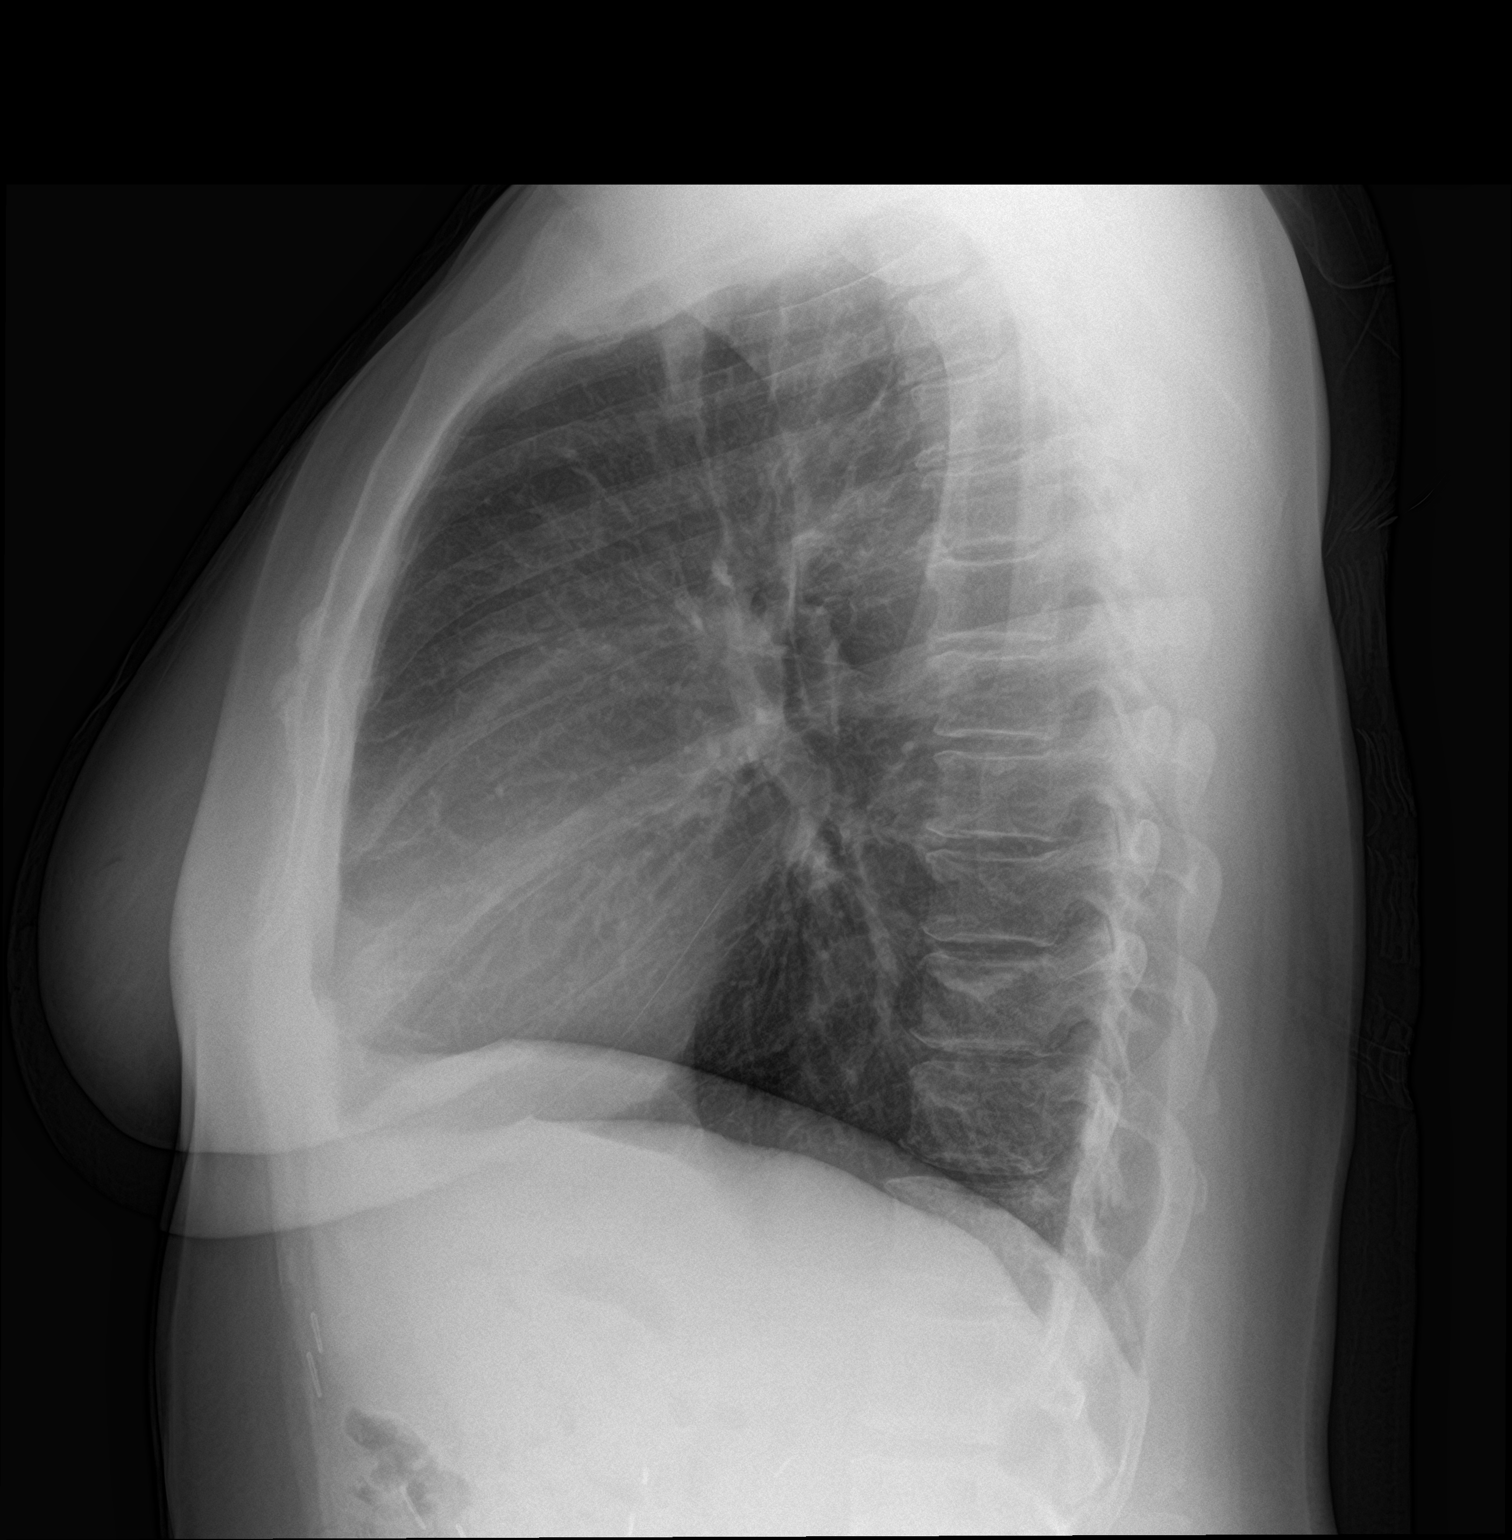

[2 of 2 positions shown; findings below may reference images not displayed]

FINDINGS: The heart size and mediastinal contours are within normal limits.
Both lungs are clear. The visualized skeletal structures are
unremarkable.
IMPRESSION: No active cardiopulmonary disease.

## 2023-01-19 ENCOUNTER — Encounter: Payer: Self-pay | Admitting: Acute Care
